# Patient Record
Sex: Female | Born: 1950
Health system: Southern US, Community
[De-identification: ages and names within clinical notes are randomized; demographics above are authoritative.]

---

## 2012-06-20 DIAGNOSIS — G479 Sleep disorder, unspecified: Secondary | ICD-10-CM | POA: Insufficient documentation

## 2013-02-19 LAB — CBC AND DIFFERENTIAL
Hemoglobin: 13.3 g/dL (ref 12.0–16.0)
Platelets: 192 10*3/uL (ref 150–399)
WBC: 3.4 10^3/mL

## 2013-02-19 LAB — LIPID PANEL
CHOLESTEROL: 154 mg/dL (ref 0–200)
HDL: 57 mg/dL (ref 35–70)
LDL Cholesterol: 75 mg/dL
Triglycerides: 72 mg/dL (ref 40–160)

## 2013-08-21 LAB — LIPID PANEL
Cholesterol: 168 mg/dL (ref 0–200)
HDL: 56 mg/dL (ref 35–70)
LDL Cholesterol: 96 mg/dL
Triglycerides: 119 mg/dL (ref 40–160)

## 2013-08-21 LAB — VITAMIN D 25 HYDROXY (VIT D DEFICIENCY, FRACTURES): VIT D 25 HYDROXY: 53

## 2013-09-02 LAB — HM DEXA SCAN

## 2013-12-23 LAB — LIPID PANEL
Cholesterol: 163 mg/dL (ref 0–200)
HDL: 53 mg/dL (ref 35–70)
LDL CALC: 90 mg/dL
TRIGLYCERIDES: 93 mg/dL (ref 40–160)

## 2014-03-27 DIAGNOSIS — S52502A Unspecified fracture of the lower end of left radius, initial encounter for closed fracture: Secondary | ICD-10-CM | POA: Insufficient documentation

## 2014-04-07 ENCOUNTER — Ambulatory Visit: Payer: Self-pay | Admitting: Family Medicine

## 2014-04-15 DIAGNOSIS — Z9889 Other specified postprocedural states: Secondary | ICD-10-CM | POA: Insufficient documentation

## 2014-04-29 HISTORY — PX: OTHER SURGICAL HISTORY: SHX169

## 2014-05-06 ENCOUNTER — Encounter: Payer: Self-pay | Admitting: Family Medicine

## 2014-05-06 ENCOUNTER — Ambulatory Visit (INDEPENDENT_AMBULATORY_CARE_PROVIDER_SITE_OTHER): Payer: BC Managed Care – PPO | Admitting: Family Medicine

## 2014-05-06 VITALS — BP 111/65 | HR 83 | Ht 67.5 in | Wt 194.0 lb

## 2014-05-06 DIAGNOSIS — G4700 Insomnia, unspecified: Secondary | ICD-10-CM

## 2014-05-06 DIAGNOSIS — E663 Overweight: Secondary | ICD-10-CM | POA: Diagnosis not present

## 2014-05-06 DIAGNOSIS — E785 Hyperlipidemia, unspecified: Secondary | ICD-10-CM | POA: Diagnosis not present

## 2014-05-06 DIAGNOSIS — S62102G Fracture of unspecified carpal bone, left wrist, subsequent encounter for fracture with delayed healing: Secondary | ICD-10-CM

## 2014-05-06 MED ORDER — TRIAMCINOLONE ACETONIDE 0.5 % EX OINT: 1.0000 "application " | TOPICAL_OINTMENT | Freq: Every day | CUTANEOUS | Status: DC

## 2014-05-06 NOTE — Patient Instructions (Signed)
My Fitness Pal
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
## 2014-05-13 LAB — COMPLETE METABOLIC PANEL WITH GFR
ALT: 13 U/L (ref 0–35)
AST: 14 U/L (ref 0–37)
Albumin: 4.2 g/dL (ref 3.5–5.2)
Alkaline Phosphatase: 60 U/L (ref 39–117)
BILIRUBIN TOTAL: 0.6 mg/dL (ref 0.2–1.2)
BUN: 17 mg/dL (ref 6–23)
CHLORIDE: 104 meq/L (ref 96–112)
CO2: 29 mEq/L (ref 19–32)
Calcium: 9.2 mg/dL (ref 8.4–10.5)
Creat: 0.94 mg/dL (ref 0.50–1.10)
GFR, Est African American: 74 mL/min
GFR, Est Non African American: 64 mL/min
Glucose, Bld: 88 mg/dL (ref 70–99)
Potassium: 4.4 mEq/L (ref 3.5–5.3)
SODIUM: 141 meq/L (ref 135–145)
TOTAL PROTEIN: 6.7 g/dL (ref 6.0–8.3)

## 2014-05-13 LAB — LIPID PANEL
CHOL/HDL RATIO: 2.8 ratio
Cholesterol: 179 mg/dL (ref 0–200)
HDL: 65 mg/dL (ref >=46)
LDL CALC: 93 mg/dL (ref 0–99)
Triglycerides: 103 mg/dL (ref <150)
VLDL: 21 mg/dL (ref 0–40)

## 2014-05-13 NOTE — Progress Notes (Signed)
Quick Note:  All labs are normal. ______ 

## 2014-05-21 ENCOUNTER — Encounter: Payer: Self-pay | Admitting: Family Medicine

## 2014-05-21 DIAGNOSIS — M8580 Other specified disorders of bone density and structure, unspecified site: Secondary | ICD-10-CM | POA: Insufficient documentation

## 2014-05-21 DIAGNOSIS — Z87891 Personal history of nicotine dependence: Secondary | ICD-10-CM | POA: Insufficient documentation

## 2014-06-05 ENCOUNTER — Encounter: Payer: Self-pay | Admitting: Family Medicine

## 2014-07-09 ENCOUNTER — Telehealth: Payer: Self-pay | Admitting: *Deleted

## 2014-07-09 DIAGNOSIS — H8110 Benign paroxysmal vertigo, unspecified ear: Secondary | ICD-10-CM

## 2014-07-09 NOTE — Telephone Encounter (Signed)
Pt called and stated that she is currently being seen by PT in [place] @ [facility] for her wrist and she is now experiencing vetigo and you would like Dr. Linford ArnoldMetheney to put orders in for her to get tx for this. She was asking if this was possible. I asked her about her sx and she informed me that she has been experiencing dizziness upon standing, and sometimes when she is walking. I advised her that Dr. Jarrett SohoMetheneny could see her for this, and that we have a hand-out that I could send her that has exercises that would help, or that she could go online and look this up. She states that she would still like for the referral to be placed and that she was going to call our PT department to see if her insurance will cover her coming here to have her PT done. Otherwise she would like for me to send the referral to [facility]. Referral fax# [phone] ph: [phone].Loralee PacasBarkley, [place]zon [place]

## 2014-07-24 ENCOUNTER — Telehealth: Payer: Self-pay | Admitting: *Deleted

## 2014-07-24 NOTE — Telephone Encounter (Signed)
Pt is experiencing vertigo and would like to be seen. She stated that she is being treated by the rehab however she's not feeling well today. Pt given appt for tomorrow.Alexis PacasBarkley, Christain Niznik [place]

## 2014-07-25 ENCOUNTER — Ambulatory Visit (INDEPENDENT_AMBULATORY_CARE_PROVIDER_SITE_OTHER): Payer: BC Managed Care – PPO | Admitting: Family Medicine

## 2014-07-25 ENCOUNTER — Encounter: Payer: Self-pay | Admitting: Family Medicine

## 2014-07-25 VITALS — BP 166/106 | HR 76 | Wt 192.0 lb

## 2014-07-25 DIAGNOSIS — R42 Dizziness and giddiness: Secondary | ICD-10-CM | POA: Diagnosis not present

## 2014-07-25 DIAGNOSIS — I10 Essential (primary) hypertension: Secondary | ICD-10-CM | POA: Diagnosis not present

## 2014-07-25 MED ORDER — HYDROCHLOROTHIAZIDE 25 MG PO TABS: ORAL_TABLET | ORAL | Status: DC

## 2014-07-25 MED ORDER — MECLIZINE HCL 25 MG PO TABS: 25.0000 mg | ORAL_TABLET | Freq: Three times a day (TID) | ORAL | Status: DC | PRN

## 2014-07-25 NOTE — Progress Notes (Signed)
CC: Alexis Fisher is a 64 y.o. female is here for Dizziness   Subjective: HPI:  Dizziness that has been present for the last month. Present almost on a daily basis but has improved since getting vestibular rehabilitation. She's had 5 days straight of resolution of her dizziness however it returned yesterday. It's severe in severity, episodic, worse with any rotational movement of the head or lying down. No medication interventions as of yet. Denies any other motor or sensory disturbances. Denies any cognitive changes from baseline. Denies any disorientation. Has a headache today in severity nothing particularly makes it better or worse. Denies any recent head trauma. States that blood pressure has always been normal in the past but rehabilitation systolic was 160 yesterday.   Review Of Systems Outlined In HPI  No past medical history on file.  Past Surgical History  Procedure Laterality Date  . Wrsit surgery, plate Left 1/61093/2016    Dr. Dewaine OatsBrumfield   Family History  Problem Relation Age of Onset  . Kidney failure Father     History   Social History  . Marital Status: Married    Spouse Name: Alexis Fisher  . Number of Children: 2  . Years of Education: N/A   Occupational History  . retired    Social History Main Topics  . Smoking status: Former Smoker    Types: Cigarettes    Quit date: 02/28/1998  . Smokeless tobacco: Not on file  . Alcohol Use: 2.4 oz/week    4 Glasses of wine per week     Comment: wine and beer  . Drug Use: No  . Sexual Activity:    Partners: Male   Other Topics Concern  . 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 Married to [place] with 2 adult children.     Objective: BP 166/106 mmHg  Pulse 76  Wt 192 lb (87.091 kg)  General: Alert and Oriented, No Acute Distress HEENT: Pupils equal, round, reactive to light. Conjunctivae clear.  External ears unremarkable,  canals clear with intact TMs with appropriate landmarks.  Middle ear appears open without effusion. Pink inferior turbinates.  Moist mucous membranes, pharynx without inflammation nor lesions.  Neck supple without palpable lymphadenopathy nor abnormal masses. Lungs: Clear and comfortable work of breathing Cardiac: Regular rate and rhythm Cranial nerves II through XII grossly intact Extremities: No peripheral edema.  Strong peripheral pulses.  Mental Status: No depression, anxiety, nor agitation. Skin: Warm and dry.  Assessment & Plan: Alexis Fisher was seen today for dizziness.  Diagnoses and all orders for this visit:  Essential hypertension Orders: -     hydrochlorothiazide (HYDRODIURIL) 25 MG tablet; One tablet by mouth every morning.  Dizziness Orders: -     meclizine (ANTIVERT) 25 MG tablet; Take 1-2 tablets (25-50 mg total) by mouth 3 (three) times daily as needed for dizziness.   essential hypertension: New diagnosis, Start hydrochlorothiazide and record blood pressures twice a day drop them off for my review on Tuesday. Dizziness: Continue to see vestibular rehabilitation start Antivert as needed.  Signs and symptoms requring emergent/urgent reevaluation were discussed with the patient.    Return Dr. Linford ArnoldMetheney in two weeks..Marland Kitchen

## 2014-07-29 ENCOUNTER — Telehealth: Payer: Self-pay | Admitting: *Deleted

## 2014-07-29 MED ORDER — DIAZEPAM 5 MG PO TABS: 2.5000 mg | ORAL_TABLET | Freq: Two times a day (BID) | ORAL | Status: DC | PRN

## 2014-07-29 NOTE — Telephone Encounter (Signed)
Alexis Fisher, Valium as a substitute to meclizine Rx placed in in-box ready for pickup/faxing. Please also ask that she schedule an appointment with Dr. Judie PetitM late this or early next week for continuity of care purposes since I saw her for an acute visit.

## 2014-07-29 NOTE — Telephone Encounter (Signed)
Pt states she is really not feeling that much better since having been on the meclizine. She is taking two tablets every 8 hours.please advise

## 2014-07-30 NOTE — Telephone Encounter (Signed)
Pt notified and voiced understanding 

## 2014-10-28 LAB — HM MAMMOGRAPHY

## 2014-11-06 ENCOUNTER — Other Ambulatory Visit (HOSPITAL_COMMUNITY)
Admission: RE | Admit: 2014-11-06 | Discharge: 2014-11-06 | Disposition: A | Discharge status: Home / Self Care | Payer: BC Managed Care – PPO | Source: Ambulatory Visit | Attending: Family Medicine | Admitting: Family Medicine

## 2014-11-06 ENCOUNTER — Ambulatory Visit (INDEPENDENT_AMBULATORY_CARE_PROVIDER_SITE_OTHER): Payer: BC Managed Care – PPO | Admitting: Family Medicine

## 2014-11-06 ENCOUNTER — Encounter: Payer: Self-pay | Admitting: Family Medicine

## 2014-11-06 VITALS — BP 123/73 | HR 79 | Temp 97.9°F | Ht 67.5 in | Wt 196.0 lb

## 2014-11-06 DIAGNOSIS — Z1151 Encounter for screening for human papillomavirus (HPV): Secondary | ICD-10-CM | POA: Insufficient documentation

## 2014-11-06 DIAGNOSIS — R0683 Snoring: Secondary | ICD-10-CM

## 2014-11-06 DIAGNOSIS — Z0000 Encounter for general adult medical examination without abnormal findings: Secondary | ICD-10-CM | POA: Diagnosis not present

## 2014-11-06 DIAGNOSIS — Z01419 Encounter for gynecological examination (general) (routine) without abnormal findings: Secondary | ICD-10-CM | POA: Insufficient documentation

## 2014-11-06 DIAGNOSIS — Z0189 Encounter for other specified special examinations: Secondary | ICD-10-CM | POA: Diagnosis not present

## 2014-11-06 DIAGNOSIS — Z23 Encounter for immunization: Secondary | ICD-10-CM

## 2014-11-06 DIAGNOSIS — G4700 Insomnia, unspecified: Secondary | ICD-10-CM

## 2014-11-06 MED ORDER — TRAZODONE HCL 50 MG PO TABS: 50.0000 mg | ORAL_TABLET | Freq: Every evening | ORAL | Status: DC | PRN

## 2014-11-06 NOTE — Progress Notes (Signed)
Subjective:     Alexis Fisher is a 64 y.o. female and is here for a comprehensive physical exam. The patient reports no problems. She's doing well with the trazodone for sleep. She says most of the time she tries to take half of a tab. Occasionally will take a whole tab. Says sometimes a whole tab doesn't seem to work but she really doesn't want to go up on her dose. She would like a printed prescription today.  Social History   Social History  . Marital Status: Married    Spouse Name: Ramon Dredge  . Number of Children: 2  . Years of Education: N/A   Occupational History  . retired    Social History Main Topics  . Smoking status: Former Smoker    Types: Cigarettes    Quit date: 02/28/1998  . Smokeless tobacco: Not on file  . Alcohol Use: 2.4 oz/week    4 Glasses of wine per week     Comment: wine and beer  . Drug Use: No  . Sexual Activity:    Partners: Male   Other Topics Concern  . 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 Married to John Sevier with 2 adult children.   Health Maintenance  Topic Date Due  . Hepatitis C Screening  [birth year], 1950  . HIV Screening  12/20/1964  . TETANUS/TDAP  12/20/1968  . PAP SMEAR  12/21/1970  . MAMMOGRAM  12/21/1999  . ZOSTAVAX  12/20/2009  . INFLUENZA VACCINE  09/29/2014  . COLONOSCOPY  02/29/2024    The following portions of the patient's history were reviewed and updated as appropriate: allergies, current medications, past family history, past medical history, past social history, past surgical history and problem list.  Review of Systems A comprehensive review of systems was negative.   Objective:    BP 123/73 mmHg  Pulse 79  Temp(Src) 97.9 F (36.6 C)  Ht 5' 7.5" (1.715 m)  Wt 196 lb (88.905 kg)  BMI 30.23 kg/m2 General appearance: alert, cooperative and appears stated age Head: Normocephalic, without obvious abnormality,  atraumatic Eyes: conj clear, EOMI, PEERLA Ears: normal TM's and external ear canals both ears Nose: Nares normal. Septum midline. Mucosa normal. No drainage or sinus tenderness. Throat: lips, mucosa, and tongue normal; teeth and gums normal Neck: no adenopathy, no carotid bruit, no JVD, supple, symmetrical, trachea midline and thyroid not enlarged, symmetric, no tenderness/mass/nodules Back: symmetric, no curvature. ROM normal. No CVA tenderness. Lungs: clear to auscultation bilaterally Breasts: normal appearance, no masses or tenderness Heart: regular rate and rhythm, S1, S2 normal, no murmur, click, rub or gallop Abdomen: soft, non-tender; bowel sounds normal; no masses,  no organomegaly Pelvic: cervix normal in appearance, external genitalia normal, no adnexal masses or tenderness, no cervical motion tenderness, rectovaginal septum normal, uterus normal size, shape, and consistency, vagina normal without discharge and question smallpolp at opening of cervix. Very tiny though. Recommend repeat pelvic exam next year.  Extremities: extremities normal, atraumatic, no cyanosis or edema Pulses: 2+ and symmetric Skin: Skin color, texture, turgor normal. No rashes or lesions Lymph nodes: Cervical, supraclavicular, and axillary nodes normal. Neurologic: Alert and oriented X 3, normal strength and tone. Normal symmetric reflexes. Normal coordination and gait    Assessment:    Healthy female exam.      Plan:     See After Visit Summary for Counseling Recommendations  Keep up a regular exercise program and  make sure you are eating a healthy diet Try to eat 4 servings of dairy a day, or if you are lactose intolerant take a calcium with vitamin D daily.  Your vaccines are up to date.   Insomnia-doing well with the trazodone. Refill printed today.  Snoring x 5+ years. Family wanted her to discuss with [place] today. She said she still has her adenoids and is mostly a mouth breather even during the  daytime. She says the snoring has gotten so bad that she has been now sleeping in separate rooms. She denies any known apneas. She denies any morning headaches and denies any excessive daytime sleepiness. She does report waking up and not feeling refreshed. Her STOP BANG questionnair is neg so will refer her to ENT for further evaluation.

## 2014-11-06 NOTE — Patient Instructions (Signed)
Keep up a regular exercise program and make sure you are eating a healthy diet Try to eat 4 servings of dairy a day, or if you are lactose intolerant take a calcium with vitamin D daily.  Your vaccines are up to date.   

## 2014-11-11 LAB — CYTOLOGY - PAP

## 2014-11-12 ENCOUNTER — Other Ambulatory Visit: Payer: Self-pay | Admitting: *Deleted

## 2014-11-12 DIAGNOSIS — H8110 Benign paroxysmal vertigo, unspecified ear: Secondary | ICD-10-CM

## 2014-11-14 ENCOUNTER — Other Ambulatory Visit: Payer: Self-pay | Admitting: *Deleted

## 2014-11-14 DIAGNOSIS — R42 Dizziness and giddiness: Secondary | ICD-10-CM

## 2014-11-14 MED ORDER — MECLIZINE HCL 25 MG PO TABS: 25.0000 mg | ORAL_TABLET | Freq: Three times a day (TID) | ORAL | Status: DC | PRN

## 2014-12-22 ENCOUNTER — Ambulatory Visit (INDEPENDENT_AMBULATORY_CARE_PROVIDER_SITE_OTHER): Payer: Medicare (Managed Care) | Admitting: Family Medicine

## 2014-12-22 ENCOUNTER — Encounter: Payer: Self-pay | Admitting: Family Medicine

## 2014-12-22 VITALS — BP 133/94 | HR 100 | Wt 192.0 lb

## 2014-12-22 DIAGNOSIS — H6982 Other specified disorders of Eustachian tube, left ear: Secondary | ICD-10-CM

## 2014-12-22 MED ORDER — PREDNISONE 20 MG PO TABS: ORAL_TABLET | ORAL | Status: AC
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
## 2015-02-20 ENCOUNTER — Other Ambulatory Visit: Payer: Self-pay | Admitting: Family Medicine

## 2015-02-20 MED ORDER — TRAZODONE HCL 50 MG PO TABS: 50.0000 mg | ORAL_TABLET | Freq: Every evening | ORAL | Status: DC | PRN

## 2015-02-20 NOTE — Addendum Note (Signed)
Addended by: Collie SiadICHARDSON, Maridee Slape M on: 02/20/2015 03:36 PM   Modules accepted: Orders, Medications

## 2015-04-07 ENCOUNTER — Encounter: Payer: Self-pay | Admitting: Family Medicine

## 2015-04-17 ENCOUNTER — Other Ambulatory Visit: Payer: Self-pay | Admitting: Physician Assistant

## 2015-04-17 MED ORDER — ATORVASTATIN CALCIUM 40 MG PO TABS: 40.0000 mg | ORAL_TABLET | Freq: Every day | ORAL | Status: DC

## 2015-04-21 ENCOUNTER — Telehealth: Payer: Self-pay | Admitting: Family Medicine

## 2015-04-21 ENCOUNTER — Other Ambulatory Visit: Payer: Self-pay

## 2015-04-21 DIAGNOSIS — E785 Hyperlipidemia, unspecified: Secondary | ICD-10-CM

## 2015-04-21 DIAGNOSIS — Z114 Encounter for screening for human immunodeficiency virus [HIV]: Secondary | ICD-10-CM

## 2015-04-21 DIAGNOSIS — Z0000 Encounter for general adult medical examination without abnormal findings: Secondary | ICD-10-CM

## 2015-04-21 DIAGNOSIS — M8580 Other specified disorders of bone density and structure, unspecified site: Secondary | ICD-10-CM

## 2015-04-21 DIAGNOSIS — Z1159 Encounter for screening for other viral diseases: Secondary | ICD-10-CM

## 2015-04-21 MED ORDER — ATORVASTATIN CALCIUM 40 MG PO TABS: 40.0000 mg | ORAL_TABLET | Freq: Every day | ORAL | Status: DC

## 2015-04-21 NOTE — Telephone Encounter (Signed)
Orders placed.Alexis Fisher  

## 2015-04-21 NOTE — Telephone Encounter (Signed)
Patient has an appointment scheduled for 06/01/15 at 2:30 and would like a lab order sent down the week before so she can go over her results at her appointment. Thanks

## 2015-05-21 LAB — COMPLETE METABOLIC PANEL WITH GFR
ALBUMIN: 3.9 g/dL (ref 3.6–5.1)
ALK PHOS: 49 U/L (ref 33–130)
ALT: 14 U/L (ref 6–29)
AST: 14 U/L (ref 10–35)
BILIRUBIN TOTAL: 0.6 mg/dL (ref 0.2–1.2)
BUN: 17 mg/dL (ref 7–25)
CALCIUM: 9 mg/dL (ref 8.6–10.4)
CO2: 27 mmol/L (ref 20–31)
Chloride: 106 mmol/L (ref 98–110)
Creat: 1.02 mg/dL — ABNORMAL HIGH (ref 0.50–0.99)
GFR, EST AFRICAN AMERICAN: 67 mL/min (ref >=60)
GFR, EST NON AFRICAN AMERICAN: 58 mL/min — AB (ref >=60)
Glucose, Bld: 88 mg/dL (ref 65–99)
POTASSIUM: 4.2 mmol/L (ref 3.5–5.3)
Sodium: 139 mmol/L (ref 135–146)
TOTAL PROTEIN: 6.1 g/dL (ref 6.1–8.1)

## 2015-05-21 LAB — LIPID PANEL
CHOL/HDL RATIO: 2.7 ratio (ref <=5.0)
Cholesterol: 155 mg/dL (ref 125–200)
HDL: 57 mg/dL (ref >=46)
LDL CALC: 79 mg/dL (ref <130)
Triglycerides: 97 mg/dL (ref <150)
VLDL: 19 mg/dL (ref <30)

## 2015-05-21 LAB — VITAMIN D 25 HYDROXY (VIT D DEFICIENCY, FRACTURES): VIT D 25 HYDROXY: 34 ng/mL (ref 30–100)

## 2015-05-21 LAB — HEPATITIS C ANTIBODY: HCV Ab: NEGATIVE

## 2015-05-21 LAB — HIV ANTIBODY (ROUTINE TESTING W REFLEX): HIV 1&2 Ab, 4th Generation: NONREACTIVE

## 2015-06-01 ENCOUNTER — Encounter: Payer: Self-pay | Admitting: Family Medicine

## 2015-06-01 ENCOUNTER — Ambulatory Visit (INDEPENDENT_AMBULATORY_CARE_PROVIDER_SITE_OTHER): Payer: Medicare (Managed Care) | Admitting: Family Medicine

## 2015-06-01 VITALS — BP 108/76 | HR 67 | Wt 189.0 lb

## 2015-06-01 DIAGNOSIS — N183 Chronic kidney disease, stage 3 unspecified: Secondary | ICD-10-CM

## 2015-06-01 DIAGNOSIS — E559 Vitamin D deficiency, unspecified: Secondary | ICD-10-CM | POA: Diagnosis not present

## 2015-06-01 DIAGNOSIS — E785 Hyperlipidemia, unspecified: Secondary | ICD-10-CM

## 2015-06-01 LAB — POCT URINALYSIS DIPSTICK
Bilirubin, UA: NEGATIVE
Glucose, UA: NEGATIVE
Ketones, UA: NEGATIVE
Leukocytes, UA: NEGATIVE
NITRITE UA: NEGATIVE
PH UA: 5.5
Protein, UA: NEGATIVE
UROBILINOGEN UA: 0.2

## 2015-06-01 LAB — POCT UA - MICROALBUMIN
Albumin/Creatinine Ratio, Urine, POC: <30
CREATININE, POC: 100 mg/dL
MICROALBUMIN (UR) POC: 10 mg/L

## 2015-06-01 MED ORDER — TRAZODONE HCL 50 MG PO TABS: 50.0000 mg | ORAL_TABLET | Freq: Every evening | ORAL | Status: DC | PRN

## 2015-06-01 NOTE — Progress Notes (Signed)
   Subjective:    Patient ID: Alexis FormSusan Gerwig, female    DOB: 1950-08-20, 65 y.o.   MRN: [id number]  HPI She is here today to go over her blood work. She was negative for hepatitis C and negative for HIV. Her vitamin D was a little bit low but she wasn't sure what kind to start taking over-the-counter so she has not picked that up yet.   No prior history of kidney disease. Her father had renal failure but his was felt to be secondary to medications that he was taking for his gout. She also has a sister who has some type of genetic issue that has caused renal failure as well.  Hyperlipidemia-tolerating statin well without any side effects or problems.  Review of Systems   He has never noticed that she urinates a little bit more frequently.    Objective:   Physical Exam  Constitutional: She is oriented to person, place, and time. She appears well-developed and well-nourished.  HENT:  Head: Normocephalic and atraumatic.  Eyes: Conjunctivae and EOM are normal.  Cardiovascular: Normal rate.   Pulmonary/Chest: Effort normal.  Neurological: She is alert and oriented to person, place, and time.  Skin: Skin is dry. No pallor.  Psychiatric: She has a normal mood and affect. Her behavior is normal.  Vitals reviewed.      Assessment & Plan:  Vitamin D deficiency-recommend over-the-counter vitamin D3 1000 international units daily.  CKD 3 - discussed new diagnosis. Recommend urine micral albumin be performed. Also recommend renal ultrasound for further evaluation. Recheck function in   Hyperlipidemia-doing well on statin. Lipids are up-to-date.

## 2015-06-04 ENCOUNTER — Ambulatory Visit (INDEPENDENT_AMBULATORY_CARE_PROVIDER_SITE_OTHER): Payer: Medicare Other

## 2015-06-04 DIAGNOSIS — N183 Chronic kidney disease, stage 3 unspecified: Secondary | ICD-10-CM

## 2015-06-15 ENCOUNTER — Encounter: Payer: Self-pay | Admitting: Physician Assistant

## 2015-06-15 ENCOUNTER — Ambulatory Visit (INDEPENDENT_AMBULATORY_CARE_PROVIDER_SITE_OTHER): Payer: Medicare Other | Admitting: Physician Assistant

## 2015-06-15 VITALS — BP 108/68 | HR 83 | Temp 99.1°F | Ht 67.5 in | Wt 185.0 lb

## 2015-06-15 DIAGNOSIS — J208 Acute bronchitis due to other specified organisms: Secondary | ICD-10-CM

## 2015-06-15 MED ORDER — AZITHROMYCIN 250 MG PO TABS: ORAL_TABLET | ORAL | Status: DC

## 2015-06-15 MED ORDER — BENZONATATE 200 MG PO CAPS: 200.0000 mg | ORAL_CAPSULE | Freq: Two times a day (BID) | ORAL | Status: DC | PRN

## 2015-06-15 NOTE — Patient Instructions (Signed)

## 2015-06-15 NOTE — Progress Notes (Signed)
   Subjective:    Patient ID: Alexis Fisher, female    DOB: 12/14/1950, 65 y.o.   MRN: [id number]  HPI Patient is a 65 year old female who presents to the clinic with 4 days of cough, fatigue, weakness. Her sister came in from out of town and was also sick. She feels like she had the same thing as her sister. Claybon JabsHurst has continued to worsen and seems to be more severe. She is taking DayQuil and NyQuil regularly with little to no relief. Nothing up productive yellow to green sputum. She slept most of the day today. She has had a low-grade fever. She has off and on cold sweat and body aches. She denies any nausea or vomiting. She denies any sinus pressure, ear pain or headache. She does have a sore throat.   Review of Systems See HPI.     Objective:   Physical Exam  Constitutional: She is oriented to person, place, and time. She appears well-developed and well-nourished.  HENT:  Head: Normocephalic and atraumatic.  Right Ear: External ear normal.  Left Ear: External ear normal.  Nose: Nose normal.  Mouth/Throat: Oropharynx is clear and moist. No oropharyngeal exudate.  Eyes: Conjunctivae are normal. Right eye exhibits no discharge. Left eye exhibits no discharge.  Neck: Normal range of motion. Neck supple.  Cardiovascular: Normal rate, regular rhythm and normal heart sounds.   Pulmonary/Chest:  No wheezing, rhonchi, crackles.  Cough on exam that was persistent.  Lymphadenopathy:    She has no cervical adenopathy.  Neurological: She is alert and oriented to person, place, and time.  Psychiatric: She has a normal mood and affect. Her behavior is normal.          Assessment & Plan:  Acute bronchitis-Z-Pak sent to pharmacy today. Tessalon Perles given to use as needed for cough. Discussed symptomatic care with honey and lemon drops. Can consider ibuprofen for sore throat and salt water gargles. Handout given for bronchitis. Follow-up if not improving in could consider a course of  prednisone to see if what also help with symptoms.

## 2015-06-16 DIAGNOSIS — R7989 Other specified abnormal findings of blood chemistry: Secondary | ICD-10-CM | POA: Insufficient documentation

## 2015-07-31 ENCOUNTER — Other Ambulatory Visit: Payer: Self-pay | Admitting: Family Medicine

## 2015-08-06 ENCOUNTER — Encounter: Payer: Self-pay | Admitting: Family Medicine

## 2015-08-06 ENCOUNTER — Ambulatory Visit (INDEPENDENT_AMBULATORY_CARE_PROVIDER_SITE_OTHER): Payer: Medicare Other | Admitting: Family Medicine

## 2015-08-06 VITALS — BP 132/66 | HR 59 | Wt 185.0 lb

## 2015-08-06 DIAGNOSIS — M8580 Other specified disorders of bone density and structure, unspecified site: Secondary | ICD-10-CM

## 2015-08-06 DIAGNOSIS — Z0000 Encounter for general adult medical examination without abnormal findings: Secondary | ICD-10-CM | POA: Diagnosis not present

## 2015-08-06 DIAGNOSIS — Z23 Encounter for immunization: Secondary | ICD-10-CM

## 2015-08-06 NOTE — Progress Notes (Signed)
Subjective:    Alexis Fisher is a 65 y.o. female who presents for a Welcome to Medicare exam.   She did update me today. She was recently hospitalized overnight after syncopal episode. She had been sick for about 5 days with bronchitis and upper respiratory infection. She was started on erythromycin which she says also cause some stomach upset. Unfortunately she passed out. They did a workup that was negative for any cardiac or neurologic causes and was discharged home. She has been well since then. They did recommend that we recheck a CBC post discharge.  Review of Systems comprehensive ROS is negative.          Objective:    Today's Vitals   08/06/15 0915  BP: 132/66  Pulse: 59  Weight: 185 lb (83.915 kg)  SpO2: 100%  Body mass index is 28.53 kg/(m^2).  Medications Outpatient Encounter Prescriptions as of 08/06/2015  Medication Sig  . atorvastatin (LIPITOR) 40 MG tablet TAKE 1 TABLET (40 MG TOTAL) BY MOUTH DAILY AT 6 PM.  . [DISCONTINUED] azithromycin (ZITHROMAX) 250 MG tablet Take 2 tablets now and then one tablet for 4 days.  . [DISCONTINUED] benzonatate (TESSALON) 200 MG capsule Take 1 capsule (200 mg total) by mouth 2 (two) times daily as needed for cough.  . [DISCONTINUED] traZODone (DESYREL) 50 MG tablet Take 1 tablet (50 mg total) by mouth at bedtime as needed. for sleep   No facility-administered encounter medications on file as of 08/06/2015.     History: No past medical history on file. Past Surgical History  Procedure Laterality Date  . Wrsit surgery, plate Left 1/61093/2016    Dr. Dewaine OatsBrumfield    Family History  Problem Relation Age of Onset  . Kidney failure Father    Social History   Occupational History  . retired    Social History Main Topics  . Smoking status: Former Smoker    Types: Cigarettes    Quit date: 02/28/1998  . Smokeless tobacco: Not on file  . Alcohol Use: 2.4 oz/week    4 Glasses of wine per week     Comment: wine and beer  . Drug Use:  No  . Sexual Activity:    Partners: Male    Tobacco Counseling Counseling given: Not Answered   Immunizations and Health Maintenance Immunization History  Administered Date(s) Administered  . Influenza,inj,Quad PF,36+ Mos 11/06/2014  . Tdap 11/06/2014   Health Maintenance Due  Topic Date Due  . ZOSTAVAX  12/20/2009  . DEXA SCAN  12/21/2014  . PNA vac Low Risk Adult (1 of 2 - PCV13) 12/21/2014    Activities of Daily Living No flowsheet data found.  Physical Exam  (optional), or other factors deemed appropriate based on the beneficiary's medical and social history and current clinical standards.  Physical Exam  Constitutional: She is oriented to person, place, and time. She appears well-developed and well-nourished.  HENT:  Head: Normocephalic and atraumatic.  Right Ear: External ear normal.  Left Ear: External ear normal.  Nose: Nose normal.  Mouth/Throat: Oropharynx is clear and moist. No oropharyngeal exudate.  Eyes: Conjunctivae are normal. Pupils are equal, round, and reactive to light.  Neck: Neck supple. No thyromegaly present.  Cardiovascular: Normal rate and normal heart sounds.   No murmur heard. Pulmonary/Chest: Effort normal and breath sounds normal. Right breast exhibits no inverted nipple, no mass, no nipple discharge, no skin change and no tenderness. Left breast exhibits no inverted nipple, no mass, no nipple discharge, no skin  change and no tenderness. Breasts are symmetrical.  Abdominal: Soft. Bowel sounds are normal. She exhibits no distension and no mass. There is no tenderness. There is no rebound and no guarding.  Musculoskeletal: She exhibits no edema.  Lymphadenopathy:    She has no cervical adenopathy.  Neurological: She is alert and oriented to person, place, and time. No cranial nerve deficit.  Skin: Skin is warm and dry. Rash noted.  Multiple scattered actinic keratoses on the forearms and upper arms bilaterally.  Psychiatric: She has a normal  mood and affect. Her behavior is normal.    Physical Exam  Constitutional: She is oriented to person, place, and time. She appears well-developed and well-nourished.  HENT:  Head: Normocephalic and atraumatic.  Right Ear: External ear normal.  Left Ear: External ear normal.  Nose: Nose normal.  Mouth/Throat: Oropharynx is clear and moist. No oropharyngeal exudate.  Eyes: Conjunctivae are normal. Pupils are equal, round, and reactive to light.  Neck: Neck supple. No thyromegaly present.  Cardiovascular: Normal rate and normal heart sounds.   No murmur heard. Respiratory: Effort normal and breath sounds normal. Right breast exhibits no inverted nipple, no mass, no nipple discharge, no skin change and no tenderness. Left breast exhibits no inverted nipple, no mass, no nipple discharge, no skin change and no tenderness. Breasts are symmetrical.  GI: Soft. Bowel sounds are normal. She exhibits no distension and no mass. There is no tenderness. There is no rebound and no guarding.  Musculoskeletal: She exhibits no edema.  Lymphadenopathy:    She has no cervical adenopathy.  Neurological: She is alert and oriented to person, place, and time. No cranial nerve deficit.  Skin: Skin is warm and dry. Rash noted.  Multiple scattered actinic keratoses on the forearms and upper arms bilaterally.  Psychiatric: She has a normal mood and affect. Her behavior is normal.    Advanced Directives:      Assessment:    This is a routine wellness examination for this patient .   Vision/Hearing screen  Visual Acuity Screening   Right eye Left eye Both eyes  Without correction:  With correction:       Dietary issues and exercise activities discussed:     Goals    None     Depression Screen PHQ 2/9 Scores 08/06/2015 06/01/2015  PHQ - 2 Score 0 0     Fall Risk Fall Risk  08/06/2015  Falls in the past year? No    MMSE: No flowsheet data found.  Patient Care Team: Agapito Games, MD as PCP - General (Family Medicine)     Plan:     During the course of the visit the patient was educated and counseled about the following appropriate screening and preventive services:   Vaccines to include Pneumoccal,   Electrocardiogram - EKG rate of  60 bpm, NSR, normal axis, with no changes   Cardiovascular Disease  Colorectal cancer screening  Bone density screening  Diabetes screening  Glaucoma screening- schedule year eye exam.   Mammography/PAP  Nutrition counseling  Her current medications and allergies were reviewed and needed refills of her chronic medications were ordered. The plan for yearly health maintenance was discussed and all orders and referrals were made as appropriate.  Patient Instructions (the written plan) was given to the patient.   METHENEY,CATHERINE, MD 08/06/2015

## 2015-08-06 NOTE — Patient Instructions (Signed)
Keep up a regular exercise program and make sure you are eating a healthy diet Try to eat 4 servings of dairy a day, or if you are lactose intolerant take a calcium with vitamin D daily.  Your vaccines are up to date.    [facility], [facility], Dr. Janalyn HarderStuart Tafeen [place]. Are some suggestions for dermatology. The lesions on her forearms or caught actinic keratoses and do need to be treated.

## 2015-08-10 ENCOUNTER — Encounter: Payer: Medicare (Managed Care) | Admitting: Family Medicine

## 2015-11-26 ENCOUNTER — Telehealth: Payer: Self-pay | Admitting: Family Medicine

## 2015-11-26 DIAGNOSIS — M8580 Other specified disorders of bone density and structure, unspecified site: Secondary | ICD-10-CM

## 2015-11-26 NOTE — Telephone Encounter (Signed)
Patient would like a Bone Density Order sent to [facility]. Thanks

## 2015-11-26 NOTE — Telephone Encounter (Signed)
Ok to place order 

## 2015-11-27 DIAGNOSIS — M8580 Other specified disorders of bone density and structure, unspecified site: Secondary | ICD-10-CM

## 2015-11-27 NOTE — Telephone Encounter (Signed)
Order placed

## 2015-12-07 LAB — HM MAMMOGRAPHY

## 2015-12-14 ENCOUNTER — Encounter: Payer: Self-pay | Admitting: Family Medicine

## 2016-05-11 ENCOUNTER — Other Ambulatory Visit: Payer: Self-pay | Admitting: *Deleted

## 2016-05-11 MED ORDER — ATORVASTATIN CALCIUM 40 MG PO TABS: ORAL_TABLET | ORAL | 2 refills | Status: DC

## 2016-05-16 ENCOUNTER — Ambulatory Visit (INDEPENDENT_AMBULATORY_CARE_PROVIDER_SITE_OTHER): Payer: Medicare Other | Admitting: Family Medicine

## 2016-05-16 ENCOUNTER — Encounter: Payer: Self-pay | Admitting: Family Medicine

## 2016-05-16 VITALS — BP 134/80 | HR 84 | Ht 68.0 in | Wt 189.0 lb

## 2016-05-16 DIAGNOSIS — J208 Acute bronchitis due to other specified organisms: Secondary | ICD-10-CM

## 2016-05-16 NOTE — Patient Instructions (Addendum)
Try the Delsym 12 hour cough syrup.   Increase water intake.   Acute Bronchitis, Adult Acute bronchitis is when air tubes (bronchi) in the lungs suddenly get swollen. The condition can make it hard to breathe. It can also cause these symptoms:  A cough.  Coughing up clear, yellow, or green mucus.  Wheezing.  Chest congestion.  Shortness of breath.  A fever.  Body aches.  Chills.  A sore throat. Follow these instructions at home: Medicines   Take over-the-counter and prescription medicines only as told by your doctor.  If you were prescribed an antibiotic medicine, take it as told by your doctor. Do not stop taking the antibiotic even if you start to feel better. General instructions   Rest.  Drink enough fluids to keep your pee (urine) clear or pale yellow.  Avoid smoking and secondhand smoke. If you smoke and you need help quitting, ask your doctor. Quitting will help your lungs heal faster.  Use an inhaler, cool mist vaporizer, or humidifier as told by your doctor.  Keep all follow-up visits as told by your doctor. This is important. How is this prevented? To lower your risk of getting this condition again:  Wash your hands often with soap and water. If you cannot use soap and water, use hand sanitizer.  Avoid contact with people who have cold symptoms.  Try not to touch your hands to your mouth, nose, or eyes.  Make sure to get the flu shot every year. Contact a doctor if:  Your symptoms do not get better in 2 weeks. Get help right away if:  You cough up blood.  You have chest pain.  You have very bad shortness of breath.  You become dehydrated.  You faint (pass out) or keep feeling like you are going to pass out.  You keep throwing up (vomiting).  You have a very bad headache.  Your fever or chills gets worse. This information is not intended to replace advice given to you by your health care provider. Make sure you discuss any questions you  have with your health care provider. Document Released: 08/03/2007 Document Revised: 09/23/2015 Document Reviewed: 08/05/2015 Elsevier Interactive Patient Education  2017 [employer]

## 2016-05-16 NOTE — Progress Notes (Signed)
   Subjective:    Patient ID: Alexis Fisher, female    DOB: [birth year], 66 y.o.   MRN: [id number]  HPI 66 year old female comes in today complaining of cough since Friday, approximally 4 days. She has not tried taking anything over-the-counter. She denies any fevers, chills, or sweats.  She has been having some low back pain with it.  + ST. no sinus congestion or facial pain or pressure.   Review of Systems     Objective:   Physical Exam  Constitutional: She is oriented to person, place, and time. She appears well-developed and well-nourished.  HENT:  Head: Normocephalic and atraumatic.  Right Ear: External ear normal.  Left Ear: External ear normal.  Nose: Nose normal.  Mouth/Throat: Oropharynx is clear and moist.  TMs and canals are clear.   Eyes: Conjunctivae and EOM are normal. Pupils are equal, round, and reactive to light.  Neck: Neck supple. No thyromegaly present.  Cardiovascular: Normal rate, regular rhythm and normal heart sounds.   Pulmonary/Chest: Effort normal and breath sounds normal. She has no wheezes.  Lymphadenopathy:    She has no cervical adenopathy.  Neurological: She is alert and oriented to person, place, and time.  Skin: Skin is warm and dry.  Psychiatric: She has a normal mood and affect.      Assessment & Plan:  Acute bronchitis-reassured patient this is likely viral. She's only been sick for about 4 days at this point. Gave her reassurance and encouraged her to try some over-the-counter medication such as Delsym for the cough. Hydrate well and get some rest. She's not improving in the next couple days and please give us a call back.

## 2016-05-21 ENCOUNTER — Other Ambulatory Visit: Payer: Self-pay | Admitting: Physician Assistant

## 2016-05-21 MED ORDER — BENZONATATE 200 MG PO CAPS: 200.0000 mg | ORAL_CAPSULE | Freq: Two times a day (BID) | ORAL | 0 refills | Status: DC | PRN

## 2016-05-21 NOTE — Progress Notes (Signed)
Called by after hours nurse. Pt was seen on Monday and told to take deslym. She thought she was getting better but then woke up with worse cough this morning. She would like something for cough.  I will send tessalon pearles to take up to three times a day. Follow up on Monday if not continuing to improve. Encouraged sucking on cough drops. Tandy GawJade Cherylynn Liszewski PA-C

## 2016-05-23 ENCOUNTER — Telehealth: Payer: Self-pay

## 2016-05-23 MED ORDER — AZITHROMYCIN 250 MG PO TABS: ORAL_TABLET | ORAL | 0 refills | Status: AC

## 2016-05-23 NOTE — Telephone Encounter (Signed)
Notified patient.

## 2016-05-23 NOTE — Telephone Encounter (Signed)
Pt was seen last week with cough, she has used the delsym cough, however the tessalon made her dizzy.  She is still having a deep cough, and is asking if she needs to do anything else, or let it run its course.  Please advise.

## 2016-05-23 NOTE — Telephone Encounter (Signed)
Will call in zpac,

## 2016-08-11 ENCOUNTER — Encounter: Payer: Medicare Other | Admitting: Family Medicine

## 2016-08-23 ENCOUNTER — Encounter: Payer: Self-pay | Admitting: Family Medicine

## 2016-08-23 ENCOUNTER — Telehealth: Payer: Self-pay | Admitting: Family Medicine

## 2016-08-23 ENCOUNTER — Ambulatory Visit (INDEPENDENT_AMBULATORY_CARE_PROVIDER_SITE_OTHER): Payer: Medicare Other | Admitting: Family Medicine

## 2016-08-23 VITALS — BP 110/73 | HR 75 | Ht 68.0 in | Wt 190.0 lb

## 2016-08-23 DIAGNOSIS — N183 Chronic kidney disease, stage 3 (moderate): Secondary | ICD-10-CM | POA: Diagnosis not present

## 2016-08-23 DIAGNOSIS — N1831 Chronic kidney disease, stage 3a: Secondary | ICD-10-CM | POA: Insufficient documentation

## 2016-08-23 DIAGNOSIS — Z23 Encounter for immunization: Secondary | ICD-10-CM | POA: Diagnosis not present

## 2016-08-23 DIAGNOSIS — Z0000 Encounter for general adult medical examination without abnormal findings: Secondary | ICD-10-CM | POA: Diagnosis not present

## 2016-08-23 NOTE — Progress Notes (Signed)
Subjective:     Alexis Fisher is a 66 y.o. female and is here for a comprehensive physical exam. The patient reports no problems.She did have some questions about recent blood work. Particularly the fact that her kidney function was mildly elevated.  Social History   Social History  . Marital status: Married    Spouse name: Ramon Dredgedward  . Number of children: 2  . Years of education: N/A   Occupational History  . retired    Social History Main Topics  . Smoking status: Former Smoker    Types: Cigarettes    Quit date: 02/28/1998  . Smokeless tobacco: Never Used  . Alcohol use 2.4 oz/week    4 Glasses of wine per week     Comment: wine and beer  . Drug use: No  . Sexual activity: Not Currently    Partners: Male   Other Topics Concern  . 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 Married to [place] with 2 adult children.   Health Maintenance  Topic Date Due  . DEXA SCAN  12/21/2014  . PNA vac Low Risk Adult (2 of 2 - PPSV23) 08/05/2016  . INFLUENZA VACCINE  05/12/2018 (Originally 09/28/2016)  . MAMMOGRAM  12/06/2017  . COLONOSCOPY  02/29/2024  . TETANUS/TDAP  11/05/2024  . Hepatitis C Screening  Completed    The following portions of the patient's history were reviewed and updated as appropriate: allergies, current medications, past family history, past medical history, past social history, past surgical history and problem list.  Review of Systems A comprehensive review of systems was negative.   Objective:    BP 110/73   Pulse 75   Ht 5\' 8"  (1.727 m)   Wt 190 lb (86.2 kg)   BMI 28.89 kg/m  General appearance: alert, cooperative and appears stated age Head: Normocephalic, without obvious abnormality, atraumatic Eyes: conj clear, EOMI, PEERLA Ears: normal TM's and external ear canals both ears Nose: Nares normal. Septum midline. Mucosa normal. No drainage or sinus  tenderness. Throat: lips, mucosa, and tongue normal; teeth and gums normal Neck: no adenopathy, no carotid bruit, no JVD, supple, symmetrical, trachea midline and thyroid not enlarged, symmetric, no tenderness/mass/nodules Back: symmetric, no curvature. ROM normal. No CVA tenderness. Lungs: clear to auscultation bilaterally Breasts: normal appearance, no masses or tenderness Heart: regular rate and rhythm, S1, S2 normal, no murmur, click, rub or gallop Abdomen: soft, non-tender; bowel sounds normal; no masses,  no organomegaly Pelvic: cervix normal in appearance, external genitalia normal, no adnexal masses or tenderness, no cervical motion tenderness, rectovaginal septum normal, uterus normal size, shape, and consistency, vagina normal without discharge and no cervical polyp.  Extremities: extremities normal, atraumatic, no cyanosis or edema Pulses: 2+ and symmetric Skin: Skin color, texture, turgor normal. No rashes or lesions Lymph nodes: Cervical, supraclavicular, and axillary nodes normal. Neurologic: Alert and oriented X 3, normal strength and tone. Normal symmetric reflexes. Normal coordination and gait    Assessment:    Healthy female exam.      Plan:     See After Visit Summary for Counseling Recommendations   Keep up a regular exercise program and make sure you are eating a healthy diet Try to eat 4 servings of dairy a day, or if you are lactose intolerant take a calcium with vitamin D daily.  Your vaccines are up to date.  Discussed colon cancer screening. She is interested  in: Guarded as an option. Report given encouraged her to check with her insurance. Encouraged her to reschedule her mammogram. Shingles  vaccine discussed. Last bone density July 2015. T score of -1.7. Repeat in 3-5 years.   Tetanus up-to-date. Pneumovax 23 given today.

## 2016-08-23 NOTE — Telephone Encounter (Signed)
Please call patient and let her know that based on her bone density score the recommendation is to repeat the bone density in 3-5 years. If she would like to go ahead and do that and please let me know. Please let me know where she would like to have it done. [facility].

## 2016-08-24 LAB — CBC WITH DIFFERENTIAL/PLATELET
BASOS PCT: 0 %
Basophils Absolute: 0 cells/uL (ref 0–200)
EOS ABS: 82 {cells}/uL (ref 15–500)
Eosinophils Relative: 2 %
HEMATOCRIT: 40.6 % (ref 35.0–45.0)
HEMOGLOBIN: 13.4 g/dL (ref 11.7–15.5)
LYMPHS ABS: 1025 {cells}/uL (ref 850–3900)
Lymphocytes Relative: 25 %
MCH: 28.5 pg (ref 27.0–33.0)
MCHC: 33 g/dL (ref 32.0–36.0)
MCV: 86.4 fL (ref 80.0–100.0)
MONO ABS: 328 {cells}/uL (ref 200–950)
MPV: 9.1 fL (ref 7.5–12.5)
Monocytes Relative: 8 %
Neutro Abs: 2665 cells/uL (ref 1500–7800)
Neutrophils Relative %: 65 %
Platelets: 204 10*3/uL (ref 140–400)
RBC: 4.7 MIL/uL (ref 3.80–5.10)
RDW: 13.5 % (ref 11.0–15.0)
WBC: 4.1 10*3/uL (ref 3.8–10.8)

## 2016-08-24 NOTE — Telephone Encounter (Signed)
LMOM notifying pt & requested a return call.

## 2016-08-25 LAB — COMPLETE METABOLIC PANEL WITH GFR
ALK PHOS: 56 U/L (ref 33–130)
ALT: 11 U/L (ref 6–29)
AST: 13 U/L (ref 10–35)
Albumin: 4.4 g/dL (ref 3.6–5.1)
BILIRUBIN TOTAL: 0.9 mg/dL (ref 0.2–1.2)
BUN: 16 mg/dL (ref 7–25)
CALCIUM: 9.2 mg/dL (ref 8.6–10.4)
CO2: 24 mmol/L (ref 20–31)
Chloride: 104 mmol/L (ref 98–110)
Creat: 1.09 mg/dL — ABNORMAL HIGH (ref 0.50–0.99)
GFR, EST AFRICAN AMERICAN: 61 mL/min (ref >=60)
GFR, Est Non African American: 53 mL/min — ABNORMAL LOW (ref >=60)
GLUCOSE: 93 mg/dL (ref 65–99)
Potassium: 4.4 mmol/L (ref 3.5–5.3)
SODIUM: 140 mmol/L (ref 135–146)
TOTAL PROTEIN: 6.8 g/dL (ref 6.1–8.1)

## 2016-08-25 LAB — LIPID PANEL
Cholesterol: 158 mg/dL (ref <200)
HDL: 63 mg/dL (ref >50)
LDL Cholesterol: 77 mg/dL (ref <100)
Total CHOL/HDL Ratio: 2.5 Ratio (ref <5.0)
Triglycerides: 89 mg/dL (ref <150)
VLDL: 18 mg/dL (ref <30)

## 2016-09-03 NOTE — Telephone Encounter (Signed)
Please sent letter

## 2016-09-05 NOTE — Telephone Encounter (Signed)
Letter sent.

## 2016-09-10 ENCOUNTER — Encounter: Payer: Self-pay | Admitting: Family Medicine

## 2016-12-07 LAB — HM MAMMOGRAPHY

## 2016-12-16 ENCOUNTER — Encounter: Payer: Self-pay | Admitting: Family Medicine

## 2017-03-02 ENCOUNTER — Other Ambulatory Visit: Payer: Self-pay | Admitting: Family Medicine

## 2017-08-24 ENCOUNTER — Ambulatory Visit (INDEPENDENT_AMBULATORY_CARE_PROVIDER_SITE_OTHER): Payer: Medicare Other | Admitting: Family Medicine

## 2017-08-24 ENCOUNTER — Encounter: Payer: Self-pay | Admitting: Family Medicine

## 2017-08-24 VITALS — BP 114/63 | HR 70 | Ht 68.0 in | Wt 197.0 lb

## 2017-08-24 DIAGNOSIS — E7800 Pure hypercholesterolemia, unspecified: Secondary | ICD-10-CM

## 2017-08-24 DIAGNOSIS — Z0000 Encounter for general adult medical examination without abnormal findings: Secondary | ICD-10-CM

## 2017-08-24 DIAGNOSIS — R635 Abnormal weight gain: Secondary | ICD-10-CM | POA: Diagnosis not present

## 2017-08-24 DIAGNOSIS — R600 Localized edema: Secondary | ICD-10-CM | POA: Diagnosis not present

## 2017-08-24 DIAGNOSIS — K219 Gastro-esophageal reflux disease without esophagitis: Secondary | ICD-10-CM | POA: Diagnosis not present

## 2017-08-24 DIAGNOSIS — N183 Chronic kidney disease, stage 3 (moderate): Secondary | ICD-10-CM | POA: Diagnosis not present

## 2017-08-24 DIAGNOSIS — N1831 Chronic kidney disease, stage 3a: Secondary | ICD-10-CM

## 2017-08-24 DIAGNOSIS — L299 Pruritus, unspecified: Secondary | ICD-10-CM | POA: Diagnosis not present

## 2017-08-24 NOTE — Patient Instructions (Addendum)
See handout for shingles vaccine.  Even though you have a open I would encourage you to consider getting the new one as is actually much more effective than the old one.  Is 90 to 95% effective and is covered at your pharmacy underneath your Medicare part D.  Welcome to come back and have us look at the skin lesion on your neck any time.  Your next Pap smear is due in September 2021.  In fact at that point based on current guidelines you can opt not to continue with Pap smears.    Food Choices for Gastroesophageal Reflux Disease, Adult When you have gastroesophageal reflux disease (GERD), the foods you eat and your eating habits are very important. Choosing the right foods can help ease the discomfort of GERD. Consider working with a diet and nutrition specialist (dietitian) to help you make healthy food choices. What general guidelines should I follow? Eating plan  Choose healthy foods low in fat, such as fruits, vegetables, whole grains, low-fat dairy products, and lean meat, fish, and poultry.  Eat frequent, small meals instead of three large meals each day. Eat your meals slowly, in a relaxed setting. Avoid bending over or lying down until 2-3 hours after eating.  Limit high-fat foods such as fatty meats or fried foods.  Limit your intake of oils, butter, and shortening to less than 8 teaspoons each day.  Avoid the following: ? Foods that cause symptoms. These may be different for different people. Keep a food diary to keep track of foods that cause symptoms. ? Alcohol. ? Drinking large amounts of liquid with meals. ? Eating meals during the 2-3 hours before bed.  Cook foods using methods other than frying. This may include baking, grilling, or broiling. Lifestyle   Maintain a healthy weight. Ask your health care provider what weight is healthy for you. If you need to lose weight, work with your health care provider to do so safely.  Exercise for at least 30 minutes on 5 or more  days each week, or as told by your health care provider.  Avoid wearing clothes that fit tightly around your waist and chest.  Do not use any products that contain nicotine or tobacco, such as cigarettes and e-cigarettes. If you need help quitting, ask your health care provider.  Sleep with the head of your bed raised. Use a wedge under the mattress or blocks under the bed frame to raise the head of the bed. What foods are not recommended? The items listed may not be a complete list. Talk with your dietitian about what dietary choices are best for you. Grains Pastries or quick breads with added fat. [place] toast. Vegetables Deep fried vegetables. [place] fries. Any vegetables prepared with added fat. Any vegetables that cause symptoms. For some people this may include tomatoes and tomato products, chili peppers, onions and garlic, and horseradish. Fruits Any fruits prepared with added fat. Any fruits that cause symptoms. For some people this may include citrus fruits, such as oranges, grapefruit, pineapple, and lemons. Meats and other protein foods High-fat meats, such as fatty beef or pork, hot dogs, ribs, ham, sausage, salami and bacon. Fried meat or protein, including fried fish and fried chicken. Nuts and nut butters. Dairy Whole milk and chocolate milk. Sour cream. Cream. Ice cream. Cream cheese. Milk shakes. Beverages Coffee and tea, with or without caffeine. Carbonated beverages. Sodas. Energy drinks. Fruit juice made with acidic fruits (such as orange or grapefruit). Tomato juice. Alcoholic drinks. Fats  and oils Butter. Margarine. Shortening. Ghee. Sweets and desserts Chocolate and cocoa. Donuts. Seasoning and other foods Pepper. Peppermint and spearmint. Any condiments, herbs, or seasonings that cause symptoms. For some people, this may include curry, hot sauce, or vinegar-based salad dressings. Summary  When you have gastroesophageal reflux disease (GERD), food and lifestyle  choices are very important to help ease the discomfort of GERD.  Eat frequent, small meals instead of three large meals each day. Eat your meals slowly, in a relaxed setting. Avoid bending over or lying down until 2-3 hours after eating.  Limit high-fat foods such as fatty meat or fried foods. This information is not intended to replace advice given to you by your health care provider. Make sure you discuss any questions you have with your health care provider. Document Released: 02/14/2005 Document Revised: 02/16/2016 Document Reviewed: 02/16/2016 Elsevier Interactive Patient Education  [employer].

## 2017-08-24 NOTE — Progress Notes (Signed)
Subjective:   Alexis Fisher is a 67 y.o. female who presents for Medicare Annual (Subsequent) preventive examination.   She does complain of itchy skin all over.    She also says she has been having more reflux sxs than usual. Would only notice sxs once a month and now more like weekly. Noticed sxs after eating pepperoni.  Has been using Tums and that does seem to help.  She has not been having any problems swallowing.  Food is not been getting stuck.  Usually the Tums works great.  Has also had some lower extremity swelling.  It seems to be worse when she goes to the beach.  Review of Systems:  Comprehensive ros is neg except for HPI.        Objective:     Vitals: BP 114/63   Pulse 70   Ht 5\' 8"  (1.727 m)   Wt 197 lb (89.4 kg)   SpO2 100%   BMI 29.95 kg/m   Body mass index is 29.95 kg/m.  Advanced Directives 08/24/2017 11/06/2014 05/06/2014  Does Patient Have a Medical Advance Directive? Yes No No  Type of Estate agent of [place];Living will - -  Copy of Healthcare Power of Attorney in Chart? No - copy requested - -  Would patient like information on creating a medical advance directive? - No - patient declined information No - patient declined information    Tobacco Social History   Tobacco Use  Smoking Status Former Smoker  . Types: Cigarettes  . Last attempt to quit: 02/28/1998  . Years since quitting: 19.4  Smokeless Tobacco Never Used     Counseling given: Not Answered   Clinical Intake:        Physical Exam  Constitutional: She is oriented to person, place, and time. She appears well-developed and well-nourished.  HENT:  Head: Normocephalic and atraumatic.  Right Ear: External ear normal.  Left Ear: External ear normal.  Nose: Nose normal.  Mouth/Throat: Oropharynx is clear and moist.  TMs and canals are clear.   Eyes: Pupils are equal, round, and reactive to light. Conjunctivae and EOM are normal.  Neck: Neck supple. No  thyromegaly present.  Cardiovascular: Normal rate, regular rhythm and normal heart sounds.  Radial pulse 2+ . Bilat trace ankle edema.    Pulmonary/Chest: Effort normal and breath sounds normal. She has no wheezes.  Lymphadenopathy:    She has no cervical adenopathy.  Neurological: She is alert and oriented to person, place, and time.  Skin: Skin is warm and dry.  Psychiatric: She has a normal mood and affect.                    No past medical history on file. Past Surgical History:  Procedure Laterality Date  . wrsit surgery, plate Left 03/6107   Dr. Dewaine Oats   Family History  Problem Relation Age of Onset  . Kidney failure Father    Social History   Socioeconomic History  . Marital status: Married    Spouse name: Ramon Dredge  . Number of children: 2  . Years of education: Not on file  . Highest education level: Not on file  Occupational History  . Occupation: retired  Engineer, production  . Financial resource strain: Not on file  . Food insecurity:    Worry: Not on file    Inability: Not on file  . Transportation needs:    Medical: Not on file    Non-medical: Not on file  Tobacco Use  . Smoking status: Former Smoker    Types: Cigarettes    Last attempt to quit: 02/28/1998    Years since quitting: 19.4  . Smokeless tobacco: Never Used  Substance and Sexual Activity  . Alcohol use: Yes    Alcohol/week: 2.4 oz    Types: 4 Glasses of wine per week    Comment: wine and beer  . Drug use: No  . Sexual activity: Not Currently    Partners: Male  Lifestyle  . Physical activity:    Days per week: Not on file    Minutes per session: Not on file  . Stress: Not on file  Relationships  . Social connections:    Talks on phone: Not on file    Gets together: Not on file    Attends religious service: Not on file    Active member of club or organization: Not on file    Attends meetings of clubs or organizations: Not on file    Relationship status: Not on file  Other  Topics Concern  . 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 Married to [place] with 2 adult children.    Outpatient Encounter Medications as of 08/24/2017  Medication Sig  . atorvastatin (LIPITOR) 40 MG tablet TAKE ONE TABLET BY MOUTH DAILY AT 6 PM  . cholecalciferol (VITAMIN D) 1000 units tablet Take 1,000 Units by mouth daily.   No facility-administered encounter medications on file as of 08/24/2017.     Activities of Daily Living In your present state of health, do you have any difficulty performing the following activities: 08/24/2017  Hearing? N  Vision? N  Difficulty concentrating or making decisions? N  Walking or climbing stairs? N  Dressing or bathing? N  Doing errands, shopping? N  Some recent data might be hidden    Patient Care Team: Agapito Games, MD as PCP - General (Family Medicine)    Assessment:   This is a routine wellness examination for Anavey.  Exercise Activities and Dietary recommendations Current Exercise Habits: Home exercise routine  Goals    None      Fall Risk Fall Risk  08/24/2017 08/06/2015 06/01/2015  Falls in the past year? No No No   IDepression Screen PHQ 2/9 Scores 08/24/2017 08/06/2015 06/01/2015  PHQ - 2 Score 0 0 0     Cognitive Function     6CIT Screen 08/24/2017 08/23/2016  What Year? 0 points 0 points  What month? 0 points 0 points  What time? 0 points 0 points  Count back from 20 0 points 0 points  Months in reverse 0 points 0 points  Repeat phrase 2 points 2 points  Total Score 2 2    Immunization History  Administered Date(s) Administered  . Influenza, High Dose Seasonal PF 12/25/2013  . Influenza,inj,Quad PF,6+ Mos 11/06/2014  . Pneumococcal Conjugate-13 08/06/2015  . Pneumococcal Polysaccharide-23 08/23/2016  . Tdap 04/03/2007, 11/06/2014  . Zoster 10/11/2010    Qualifies for Shingles Vaccine? Yes  Screening  Tests Health Maintenance  Topic Date Due  . INFLUENZA VACCINE  05/12/2018 (Originally 09/28/2017)  . MAMMOGRAM  12/08/2018  . COLONOSCOPY  02/29/2024  . TETANUS/TDAP  11/05/2024  . DEXA SCAN  Completed  . Hepatitis C Screening  Completed  . PNA vac Low Risk Adult  Completed    Cancer Screenings: Lung: Low Dose CT Chest recommended if Age 16-80 years, 57  pack-year currently smoking OR have quit w/in 15years. Patient does not qualify. Breast:  Up to date on Mammogram? Yes   Up to date of Bone Density/Dexa? Yes Colorectal: UTD  Additional Screenings:  Hepatitis C Screening:      Plan:    Medicare [place]   I have personally reviewed and noted the following in the patient's chart:   . Medical and social history . Use of alcohol, tobacco or illicit drugs  . Current medications and supplements . Functional ability and status . Nutritional status . Physical activity . Advanced directives . List of other physicians . Hospitalizations, surgeries, and ER visits in previous 12 months . Vitals . Screenings to include cognitive, depression, and falls . Referrals and appointments . Weight gain and itchy skin - will check TSH.  Has f/u with Derm in October as well for skin check.  She has a lesion on her neck she asked about today.   Marland Kitchen. GERD-discussed dietary changes and measures to improve her symptoms. . Lower extremity swelling-discussed that several things can contribute including salt intake dietary changes, increased alcohol as well as the heat.  She particularly notices it in the heat.  In addition, I have reviewed and discussed with patient certain preventive protocols, quality metrics, and best practice recommendations. A written personalized care plan for preventive services as well as general preventive health recommendations were provided to patient.     Nani Gasseratherine Annise Boran, MD  08/24/2017

## 2017-08-28 LAB — COMPLETE METABOLIC PANEL WITH GFR
AG Ratio: 2 (calc) (ref 1.0–2.5)
ALT: 11 U/L (ref 6–29)
AST: 13 U/L (ref 10–35)
Albumin: 4.3 g/dL (ref 3.6–5.1)
Alkaline phosphatase (APISO): 50 U/L (ref 33–130)
BUN/Creatinine Ratio: 15 (calc) (ref 6–22)
BUN: 16 mg/dL (ref 7–25)
CALCIUM: 9 mg/dL (ref 8.6–10.4)
CHLORIDE: 107 mmol/L (ref 98–110)
CO2: 26 mmol/L (ref 20–32)
Creat: 1.04 mg/dL — ABNORMAL HIGH (ref 0.50–0.99)
GFR, EST NON AFRICAN AMERICAN: 56 mL/min/{1.73_m2} — AB (ref >=60)
GFR, Est African American: 64 mL/min/{1.73_m2} (ref >=60)
GLUCOSE: 103 mg/dL — AB (ref 65–99)
Globulin: 2.1 g/dL (calc) (ref 1.9–3.7)
Potassium: 4.4 mmol/L (ref 3.5–5.3)
Sodium: 141 mmol/L (ref 135–146)
TOTAL PROTEIN: 6.4 g/dL (ref 6.1–8.1)
Total Bilirubin: 0.6 mg/dL (ref 0.2–1.2)

## 2017-08-28 LAB — LIPID PANEL
Cholesterol: 156 mg/dL (ref <200)
HDL: 54 mg/dL (ref >50)
LDL CHOLESTEROL (CALC): 85 mg/dL
NON-HDL CHOLESTEROL (CALC): 102 mg/dL (ref <130)
TRIGLYCERIDES: 84 mg/dL (ref <150)
Total CHOL/HDL Ratio: 2.9 (calc) (ref <5.0)

## 2017-08-28 LAB — TSH: TSH: 2.02 m[IU]/L (ref 0.40–4.50)

## 2017-09-11 ENCOUNTER — Other Ambulatory Visit: Payer: Self-pay | Admitting: Family Medicine

## 2017-11-29 ENCOUNTER — Other Ambulatory Visit: Payer: Self-pay | Admitting: Family Medicine

## 2018-01-31 ENCOUNTER — Ambulatory Visit (INDEPENDENT_AMBULATORY_CARE_PROVIDER_SITE_OTHER): Payer: Medicare Other | Admitting: Family Medicine

## 2018-01-31 ENCOUNTER — Encounter: Payer: Self-pay | Admitting: Family Medicine

## 2018-01-31 ENCOUNTER — Other Ambulatory Visit: Payer: Self-pay | Admitting: *Deleted

## 2018-01-31 VITALS — BP 135/74 | HR 84 | Ht 65.35 in | Wt 200.0 lb

## 2018-01-31 DIAGNOSIS — J0110 Acute frontal sinusitis, unspecified: Secondary | ICD-10-CM | POA: Diagnosis not present

## 2018-01-31 DIAGNOSIS — M84376A Stress fracture, unspecified foot, initial encounter for fracture: Secondary | ICD-10-CM | POA: Insufficient documentation

## 2018-01-31 MED ORDER — AMOXICILLIN-POT CLAVULANATE 875-125 MG PO TABS: 1.0000 | ORAL_TABLET | Freq: Two times a day (BID) | ORAL | 0 refills | Status: DC

## 2018-01-31 NOTE — Progress Notes (Signed)
Subjective:    Patient ID: Alexis Fisher, female    DOB: 1950/11/07, 67 y.o.   MRN: [id number]  HPI 89 you female c/o of cough and sinus drianage x 13 days. No fever, chills or sweats.  + facial pain and ST.  Neck hurts. She is very fatigue and leaving town.  NO GI sxs.  She has been using ADvil cold and some cough syrup. Helps some.     Review of Systems  BP 135/74   Pulse 84   Ht 5' 5.35" (1.66 m)   Wt 200 lb (90.7 kg)   SpO2 100%   BMI 32.92 kg/m     No Known Allergies  No past medical history on file.  Past Surgical History:  Procedure Laterality Date  . wrsit surgery, plate Left 1/30863/2016   Dr. Dewaine [place]    Social History   Socioeconomic History  . Marital status: Married    Spouse name: Alexis Fisher  . Number of children: 2  . Years of education: Not on file  . Highest education level: Not on file  Occupational History  . Occupation: retired  Engineer, productionocial Needs  . Financial resource strain: Not on file  . Food insecurity:    Worry: Not on file    Inability: Not on file  . Transportation needs:    Medical: Not on file    Non-medical: Not on file  Tobacco Use  . Smoking status: Former Smoker    Types: Cigarettes    Last attempt to quit: 02/28/1998    Years since quitting: 19.9  . Smokeless tobacco: Never Used  Substance and Sexual Activity  . Alcohol use: Yes    Alcohol/week: 4.0 standard drinks    Types: 4 Glasses of wine per week    Comment: wine and beer  . Drug use: No  . Sexual activity: Not Currently    Partners: Male  Lifestyle  . Physical activity:    Days per week: Not on file    Minutes per session: Not on file  . Stress: Not on file  Relationships  . Social connections:    Talks on phone: Not on file    Gets together: Not on file    Attends religious service: Not on file    Active member of club or organization: Not on file    Attends meetings of clubs or organizations: Not on file    Relationship status: Not on file  . Intimate partner  violence:    Fear of current or ex partner: Not on file    Emotionally abused: Not on file    Physically abused: Not on file    Forced sexual activity: Not on file  Other Topics Concern  . 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 Married to [place] with 2 adult children.    Family History  Problem Relation Age of Onset  . Kidney failure Father     Outpatient Encounter Medications as of 01/31/2018  Medication Sig  . atorvastatin (LIPITOR) 40 MG tablet TAKE ONE TABLET BY MOUTH DAILY AT 6 PM  . cholecalciferol (VITAMIN D) 1000 units tablet Take 1,000 Units by mouth daily.  . fluorouracil (EFUDEX) 5 % cream   . amoxicillin-clavulanate (AUGMENTIN) 875-125 MG tablet Take 1 tablet by mouth 2 (two) times daily.   No facility-administered encounter medications on file as of 01/31/2018.  Objective:   Physical Exam  Constitutional: She is oriented to person, place, and time. She appears well-developed and well-nourished.  HENT:  Head: Normocephalic and atraumatic.  Right Ear: External ear normal.  Left Ear: External ear normal.  Nose: Nose normal.  Mouth/Throat: Oropharynx is clear and moist.  TMs and canals are clear.   Eyes: Pupils are equal, round, and reactive to light. Conjunctivae and EOM are normal.  Neck: Neck supple. No thyromegaly present.  Cardiovascular: Normal rate, regular rhythm and normal heart sounds.  Pulmonary/Chest: Effort normal and breath sounds normal. She has no wheezes.  Lymphadenopathy:    She has no cervical adenopathy.  Neurological: She is alert and oriented to person, place, and time.  Skin: Skin is warm and dry.  Psychiatric: She has a normal mood and affect.        Assessment & Plan:  Acute sinusitis - will tx with augmentin.  Call if not better in one week .    Ok ot continue cough syrup.

## 2018-02-14 ENCOUNTER — Telehealth: Payer: Self-pay

## 2018-02-14 NOTE — Telephone Encounter (Signed)
[name] called and left a message stating that she has finished her antibiotic 3-4 days ago and she still has a cough and headache. I called and left a message for her to call back.

## 2018-02-15 NOTE — Telephone Encounter (Signed)
Alexis Fisher reports continued non productive cough and headaches. Denies fever, chills or sweats. She wanted to know if she needs another antibiotic. Please advise.

## 2018-02-16 MED ORDER — AZITHROMYCIN 250 MG PO TABS: ORAL_TABLET | ORAL | 0 refills | Status: AC

## 2018-02-16 NOTE — Addendum Note (Signed)
Addended by: Nani GasserMETHENEY, CATHERINE D on: 02/16/2018 08:24 AM   Modules accepted: Orders

## 2018-02-16 NOTE — Telephone Encounter (Signed)
OK I will send over zpack.

## 2018-02-16 NOTE — Telephone Encounter (Signed)
Left message advising of antibiotic.  

## 2018-03-12 LAB — HM MAMMOGRAPHY

## 2018-03-15 ENCOUNTER — Encounter: Payer: Self-pay | Admitting: Family Medicine

## 2018-11-13 ENCOUNTER — Telehealth: Payer: Self-pay | Admitting: Family Medicine

## 2018-11-13 DIAGNOSIS — N1831 Chronic kidney disease, stage 3a: Secondary | ICD-10-CM

## 2018-11-13 DIAGNOSIS — E7800 Pure hypercholesterolemia, unspecified: Secondary | ICD-10-CM

## 2018-11-13 DIAGNOSIS — Z1329 Encounter for screening for other suspected endocrine disorder: Secondary | ICD-10-CM

## 2018-11-13 DIAGNOSIS — E663 Overweight: Secondary | ICD-10-CM

## 2018-11-13 DIAGNOSIS — Z0000 Encounter for general adult medical examination without abnormal findings: Secondary | ICD-10-CM

## 2018-11-13 NOTE — Telephone Encounter (Signed)
Patient wanted Labs sent downstairs for Physical tomorrow, please advise.

## 2018-11-13 NOTE — Telephone Encounter (Signed)
Please review pended labs.

## 2018-11-13 NOTE — Telephone Encounter (Signed)
Pt advised.

## 2018-11-13 NOTE — Telephone Encounter (Signed)
Labs signed

## 2018-11-14 ENCOUNTER — Other Ambulatory Visit: Payer: Self-pay

## 2018-11-14 ENCOUNTER — Ambulatory Visit (INDEPENDENT_AMBULATORY_CARE_PROVIDER_SITE_OTHER): Payer: Medicare Other | Admitting: Family Medicine

## 2018-11-14 ENCOUNTER — Encounter: Payer: Self-pay | Admitting: Family Medicine

## 2018-11-14 VITALS — BP 124/70 | HR 67 | Temp 97.8°F | Ht 65.0 in | Wt 198.0 lb

## 2018-11-14 DIAGNOSIS — R351 Nocturia: Secondary | ICD-10-CM

## 2018-11-14 DIAGNOSIS — K219 Gastro-esophageal reflux disease without esophagitis: Secondary | ICD-10-CM | POA: Diagnosis not present

## 2018-11-14 DIAGNOSIS — Z23 Encounter for immunization: Secondary | ICD-10-CM | POA: Diagnosis not present

## 2018-11-14 DIAGNOSIS — R319 Hematuria, unspecified: Secondary | ICD-10-CM

## 2018-11-14 DIAGNOSIS — Z0000 Encounter for general adult medical examination without abnormal findings: Secondary | ICD-10-CM | POA: Diagnosis not present

## 2018-11-14 DIAGNOSIS — Z6832 Body mass index (BMI) 32.0-32.9, adult: Secondary | ICD-10-CM

## 2018-11-14 LAB — POCT URINALYSIS DIPSTICK
Bilirubin, UA: NEGATIVE
Glucose, UA: NEGATIVE
Ketones, UA: NEGATIVE
Leukocytes, UA: NEGATIVE
Nitrite, UA: NEGATIVE
Protein, UA: NEGATIVE
Spec Grav, UA: 1.015 (ref 1.010–1.025)
Urobilinogen, UA: 0.2 E.U./dL
pH, UA: 7 (ref 5.0–8.0)

## 2018-11-14 NOTE — Assessment & Plan Note (Addendum)
Will check urinalysis today.  Discussed overactive bladder medications and consider trial for 30 days.  UA was positive for small amount of blood so we will send for microscopic review to verify if she has whole red blood cells present.  No sign of infection.

## 2018-11-14 NOTE — Patient Instructions (Addendum)
Food Choices for Gastroesophageal Reflux Disease, Adult When you have gastroesophageal reflux disease (GERD), the foods you eat and your eating habits are very important. Choosing the right foods can help ease the discomfort of GERD. Consider working with a diet and nutrition specialist (dietitian) to help you make healthy food choices. What general guidelines should I follow?  Eating plan  Choose healthy foods low in fat, such as fruits, vegetables, whole grains, low-fat dairy products, and lean meat, fish, and poultry.  Eat frequent, small meals instead of three large meals each day. Eat your meals slowly, in a relaxed setting. Avoid bending over or lying down until 2-3 hours after eating.  Limit high-fat foods such as fatty meats or fried foods.  Limit your intake of oils, butter, and shortening to less than 8 teaspoons each day.  Avoid the following: ? Foods that cause symptoms. These may be different for different people. Keep a food diary to keep track of foods that cause symptoms. ? Alcohol. ? Drinking large amounts of liquid with meals. ? Eating meals during the 2-3 hours before bed.  Cook foods using methods other than frying. This may include baking, grilling, or broiling. Lifestyle  Maintain a healthy weight. Ask your health care provider what weight is healthy for you. If you need to lose weight, work with your health care provider to do so safely.  Exercise for at least 30 minutes on 5 or more days each week, or as told by your health care provider.  Avoid wearing clothes that fit tightly around your waist and chest.  Do not use any products that contain nicotine or tobacco, such as cigarettes and e-cigarettes. If you need help quitting, ask your health care provider.  Sleep with the head of your bed raised. Use a wedge under the mattress or blocks under the bed frame to raise the head of the bed. What foods are not recommended? The items listed may not be a complete  list. Talk with your dietitian about what dietary choices are best for you. Grains Pastries or quick breads with added fat. French toast. Vegetables Deep fried vegetables. French fries. Any vegetables prepared with added fat. Any vegetables that cause symptoms. For some people this may include tomatoes and tomato products, chili peppers, onions and garlic, and horseradish. Fruits Any fruits prepared with added fat. Any fruits that cause symptoms. For some people this may include citrus fruits, such as oranges, grapefruit, pineapple, and lemons. Meats and other protein foods High-fat meats, such as fatty beef or pork, hot dogs, ribs, ham, sausage, salami and bacon. Fried meat or protein, including fried fish and fried chicken. Nuts and nut butters. Dairy Whole milk and chocolate milk. Sour cream. Cream. Ice cream. Cream cheese. Milk shakes. Beverages Coffee and tea, with or without caffeine. Carbonated beverages. Sodas. Energy drinks. Fruit juice made with acidic fruits (such as orange or grapefruit). Tomato juice. Alcoholic drinks. Fats and oils Butter. Margarine. Shortening. Ghee. Sweets and desserts Chocolate and cocoa. Donuts. Seasoning and other foods Pepper. Peppermint and spearmint. Any condiments, herbs, or seasonings that cause symptoms. For some people, this may include curry, hot sauce, or vinegar-based salad dressings. Summary  When you have gastroesophageal reflux disease (GERD), food and lifestyle choices are very important to help ease the discomfort of GERD.  Eat frequent, small meals instead of three large meals each day. Eat your meals slowly, in a relaxed setting. Avoid bending over or lying down until 2-3 hours after eating.  Limit high-fat   foods such as fatty meat or fried foods. This information is not intended to replace advice given to you by your health care provider. Make sure you discuss any questions you have with your health care provider. Document Released:  02/14/2005 Document Revised: 06/07/2018 Document Reviewed: 02/16/2016 Elsevier Patient Education  2020 [employer] Maintenance for Postmenopausal Women Menopause is a normal process in which your ability to get pregnant comes to an end. This process happens slowly over many months or years, usually between the ages of 28 and 43. Menopause is complete when you have missed your menstrual periods for 12 months. It is important to talk with your health care provider about some of the most common conditions that affect women after menopause (postmenopausal women). These include heart disease, cancer, and bone loss (osteoporosis). Adopting a healthy lifestyle and getting preventive care can help to promote your health and wellness. The actions you take can also lower your chances of developing some of these common conditions. What should I know about menopause? During menopause, you may get a number of symptoms, such as:  Hot flashes. These can be moderate or severe.  Night sweats.  Decrease in sex drive.  Mood swings.  Headaches.  Tiredness.  Irritability.  Memory problems.  Insomnia. Choosing to treat or not to treat these symptoms is a decision that you make with your health care provider. Do I need hormone replacement therapy?  Hormone replacement therapy is effective in treating symptoms that are caused by menopause, such as hot flashes and night sweats.  Hormone replacement carries certain risks, especially as you become older. If you are thinking about using estrogen or estrogen with progestin, discuss the benefits and risks with your health care provider. What is my risk for heart disease and stroke? The risk of heart disease, heart attack, and stroke increases as you age. One of the causes may be a change in the body's hormones during menopause. This can affect how your body uses dietary fats, triglycerides, and cholesterol. Heart attack and stroke are medical  emergencies. There are many things that you can do to help prevent heart disease and stroke. Watch your blood pressure  High blood pressure causes heart disease and increases the risk of stroke. This is more likely to develop in people who have high blood pressure readings, are of African descent, or are overweight.  Have your blood pressure checked: ? Every 3-5 years if you are 77-80 years of age. ? Every year if you are 50 years old or older. Eat a healthy diet   Eat a diet that includes plenty of vegetables, fruits, low-fat dairy products, and lean protein.  Do not eat a lot of foods that are high in solid fats, added sugars, or sodium. Get regular exercise Get regular exercise. This is one of the most important things you can do for your health. Most adults should:  Try to exercise for at least 150 minutes each week. The exercise should increase your heart rate and make you sweat (moderate-intensity exercise).  Try to do strengthening exercises at least twice each week. Do these in addition to the moderate-intensity exercise.  Spend less time sitting. Even light physical activity can be beneficial. Other tips  Work with your health care provider to achieve or maintain a healthy weight.  Do not use any products that contain nicotine or tobacco, such as cigarettes, e-cigarettes, and chewing tobacco. If you need help quitting, ask your health care provider.  Know  your numbers. Ask your health care provider to check your cholesterol and your blood sugar (glucose). Continue to have your blood tested as directed by your health care provider. Do I need screening for cancer? Depending on your health history and family history, you may need to have cancer screening at different stages of your life. This may include screening for:  Breast cancer.  Cervical cancer.  Lung cancer.  Colorectal cancer. What is my risk for osteoporosis? After menopause, you may be at increased risk for  osteoporosis. Osteoporosis is a condition in which bone destruction happens more quickly than new bone creation. To help prevent osteoporosis or the bone fractures that can happen because of osteoporosis, you may take the following actions:  If you are 6719-69 years old, get at least 1,000 mg of calcium and at least 600 mg of vitamin D per day.  If you are older than age 69 but younger than age 69, get at least 1,200 mg of calcium and at least 600 mg of vitamin D per day.  If you are older than age 670, get at least 1,200 mg of calcium and at least 800 mg of vitamin D per day. Smoking and drinking excessive alcohol increase the risk of osteoporosis. Eat foods that are rich in calcium and vitamin D, and do weight-bearing exercises several times each week as directed by your health care provider. How does menopause affect my mental health? Depression may occur at any age, but it is more common as you become older. Common symptoms of depression include:  Low or sad mood.  Changes in sleep patterns.  Changes in appetite or eating patterns.  Feeling an overall lack of motivation or enjoyment of activities that you previously enjoyed.  Frequent crying spells. Talk with your health care provider if you think that you are experiencing depression. General instructions See your health care provider for regular wellness exams and vaccines. This may include:  Scheduling regular health, dental, and eye exams.  Getting and maintaining your vaccines. These include: ? Influenza vaccine. Get this vaccine each year before the flu season begins. ? Pneumonia vaccine. ? Shingles vaccine. ? Tetanus, diphtheria, and pertussis (Tdap) booster vaccine. Your health care provider may also recommend other immunizations. Tell your health care provider if you have ever been abused or do not feel safe at home. Summary  Menopause is a normal process in which your ability to get pregnant comes to an end.  This  condition causes hot flashes, night sweats, decreased interest in sex, mood swings, headaches, or lack of sleep.  Treatment for this condition may include hormone replacement therapy.  Take actions to keep yourself healthy, including exercising regularly, eating a healthy diet, watching your weight, and checking your blood pressure and blood sugar levels.  Get screened for cancer and depression. Make sure that you are up to date with all your vaccines. This information is not intended to replace advice given to you by your health care provider. Make sure you discuss any questions you have with your health care provider. Document Released: 04/08/2005 Document Revised: 02/07/2018 Document Reviewed: 02/07/2018 Elsevier Patient Education  2020 [employer]

## 2018-11-14 NOTE — Assessment & Plan Note (Signed)
Discussed dietary measures and things to avoid.  Additional information handout provided.  If she finds that she is using Tums frequently then she really might need to consider a PPI she could certainly take over-the-counter we could send a prescription.  I do think cutting back on her alcohol intake and greasy spicy foods would be helpful.

## 2018-11-14 NOTE — Assessment & Plan Note (Signed)
BMI 32.  She says she has been walking almost daily with her husband.  He has lost several pounds but she is only lost 2 pounds and is a little bit frustrated.  We discussed strategies around portion control as well as cutting back on alcohol over the next month to see if this makes a difference.

## 2018-11-14 NOTE — Progress Notes (Signed)
Established Patient Office Visit  Subjective:  Patient ID: Alexis Fisher, female    DOB: 11/24/1950  Age: 68 y.o. MRN: [id number]  CC:  Chief Complaint  Patient presents with  . Annual Exam    HPI Alexis Fisher presents for CPE today. She does have a couple issues that she would also like to discuss.   Getting up to urinate 3-4 times a night.  She says she is noticed an increase probably over the last 3 to 6 months.  She denies any hematuria or dysuria.  She reports that sometimes during the day she will occasionally start to leak urine before she can make it to the bathroom so she does occasionally get some urgency.  She says maybe 1 out of 10 times.  She denies any significant issues with stress incontinence.  Has been having more reflux lately. Does drink beer and eats spicey foods.  She does have some nasal congestions as well.  She occasionally uses Tums and that does seem to help.  She does drink alcohol daily.  She did cut out her evening coffee.  She would like to discuss the shingles vaccine today.  She does have a lot of sun damage to her arms and legs but says she has a follow-up with dermatology coming up soon.  History reviewed. No pertinent past medical history.  Past Surgical History:  Procedure Laterality Date  . wrsit surgery, plate Left 06/2839   Dr. Smitty Cords    Family History  Problem Relation Age of Onset  . Kidney failure Father     Social History   Socioeconomic History  . Marital status: Married    Spouse name: Percell Miller  . Number of children: 2  . Years of education: Not on file  . Highest education level: Not on file  Occupational History  . Occupation: retired  Scientific laboratory technician  . Financial resource strain: Not on file  . Food insecurity    Worry: Not on file    Inability: Not on file  . Transportation needs    Medical: Not on file    Non-medical: Not on file  Tobacco Use  . Smoking status: Former Smoker    Types: Cigarettes    Quit date:  02/28/1998    Years since quitting: 20.7  . Smokeless tobacco: Never Used  Substance and Sexual Activity  . Alcohol use: Yes    Alcohol/week: 4.0 standard drinks    Types: 4 Glasses of wine per week    Comment: wine and beer  . Drug use: No  . Sexual activity: Not Currently    Partners: Male  Lifestyle  . Physical activity    Days per week: Not on file    Minutes per session: Not on file  . Stress: Not on file  Relationships  . Social Herbalist on phone: Not on file    Gets together: Not on file    Attends religious service: Not on file    Active member of club or organization: Not on file    Attends meetings of clubs or organizations: Not on file    Relationship status: Not on file  . Intimate partner violence    Fear of current or ex partner: Not on file    Emotionally abused: Not on file    Physically abused: Not on file    Forced sexual activity: Not on file  Other Topics Concern  . 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 Married to [place] Circle[place] with 2 adult children.    Outpatient Medications Prior to Visit  Medication Sig Dispense Refill  . atorvastatin (LIPITOR) 40 MG tablet TAKE ONE TABLET BY MOUTH DAILY AT 6 PM 90 tablet 3  . cholecalciferol (VITAMIN D) 1000 units tablet Take 1,000 Units by mouth daily.    . fluorouracil (EFUDEX) 5 % cream      No facility-administered medications prior to visit.     No Known Allergies  ROS Review of Systems    Objective:    Physical Exam  Constitutional: She is oriented to person, place, and time. She appears well-developed and well-nourished.  HENT:  Head: Normocephalic and atraumatic.  Right Ear: External ear normal.  Left Ear: External ear normal.  Nose: Nose normal.  Mouth/Throat: Oropharynx is clear and moist.  TMs and canals are clear.   Eyes: Pupils are equal, round, and reactive to light. Conjunctivae and  EOM are normal.  Neck: Neck supple. No thyromegaly present.  Cardiovascular: Normal rate, regular rhythm and normal heart sounds.  Pulmonary/Chest: Effort normal and breath sounds normal. She has no wheezes. Right breast exhibits no inverted nipple, no mass, no nipple discharge, no skin change and no tenderness. Left breast exhibits no inverted nipple, no mass, no nipple discharge, no skin change and no tenderness.  Abdominal: Bowel sounds are normal. She exhibits no distension and no mass. There is no abdominal tenderness.  Musculoskeletal:        General: No edema.  Lymphadenopathy:    She has no cervical adenopathy.  Neurological: She is alert and oriented to person, place, and time.  Skin: Skin is warm and dry.  Psychiatric: She has a normal mood and affect. Her behavior is normal.    BP 124/70   Pulse 67   Temp 97.8 F (36.6 C)   Ht 5\' 5"  (1.651 m)   Wt 198 lb (89.8 kg)   SpO2 98%   BMI 32.95 kg/m  Wt Readings from Last 3 Encounters:  11/14/18 198 lb (89.8 kg)  01/31/18 200 lb (90.7 kg)  08/24/17 197 lb (89.4 kg)     Health Maintenance Due  Topic Date Due  . INFLUENZA VACCINE  09/29/2018    There are no preventive care reminders to display for this patient.  Lab Results  Component Value Date   TSH 2.02 08/28/2017   Lab Results  Component Value Date   WBC 4.1 08/23/2016   HGB 13.4 08/23/2016   HCT 40.6 08/23/2016   MCV 86.4 08/23/2016   PLT 204 08/23/2016   Lab Results  Component Value Date   NA 141 08/28/2017   K 4.4 08/28/2017   CO2 26 08/28/2017   GLUCOSE 103 (H) 08/28/2017   BUN 16 08/28/2017   CREATININE 1.04 (H) 08/28/2017   BILITOT 0.6 08/28/2017   ALKPHOS 56 08/23/2016   AST 13 08/28/2017   ALT 11 08/28/2017   PROT 6.4 08/28/2017   ALBUMIN 4.4 08/23/2016   CALCIUM 9.0 08/28/2017   Lab Results  Component Value Date   CHOL 156 08/28/2017   Lab Results  Component Value Date   HDL 54 08/28/2017   Lab Results  Component Value Date    LDLCALC 85 08/28/2017   Lab Results  Component Value Date   TRIG 84 08/28/2017   Lab Results  Component Value Date   CHOLHDL 2.9 08/28/2017   No results found for: HGBA1C  Assessment & Plan:   Problem List Items Addressed This Visit      Digestive   Gastroesophageal reflux disease    Discussed dietary measures and things to avoid.  Additional information handout provided.  If she finds that she is using Tums frequently then she really might need to consider a PPI she could certainly take over-the-counter we could send a prescription.  I do think cutting back on her alcohol intake and greasy spicy foods would be helpful.        Other   Nocturia    Will check urinalysis today.  Discussed overactive bladder medications and consider trial for 30 days.  UA was positive for small amount of blood so we will send for microscopic review to verify if she has whole red blood cells present.  No sign of infection.      Relevant Orders   POCT urinalysis dipstick (Completed)   Urinalysis, microscopic only   BMI 32.0-32.9,adult    BMI 32.  She says she has been walking almost daily with her husband.  He has lost several pounds but she is only lost 2 pounds and is a little bit frustrated.  We discussed strategies around portion control as well as cutting back on alcohol over the next month to see if this makes a difference.       Other Visit Diagnoses    Routine general medical examination at a health care facility    -  Primary   Need for immunization against influenza       Relevant Orders   Flu Vaccine QUAD High Dose(Fluad) (Completed)   Hematuria, unspecified type          Keep up a regular exercise program and make sure you are eating a healthy diet Try to eat 4 servings of dairy a day, or if you are lactose intolerant take a calcium with vitamin D daily.  Your vaccines are up to date.  She we also discussed cutting back on alcohol intake to just the weekends instead of daily.   I think this would really help in her efforts for weight loss as well as reduce her reflux symptoms.  Flu vaccine given today.    No orders of the defined types were placed in this encounter.   Follow-up: No follow-ups on file.    Nani Gasser, MD

## 2018-11-15 LAB — CBC WITH DIFFERENTIAL/PLATELET
Absolute Monocytes: 396 cells/uL (ref 200–950)
Basophils Absolute: 21 cells/uL (ref 0–200)
Basophils Relative: 0.7 %
Eosinophils Absolute: 117 cells/uL (ref 15–500)
Eosinophils Relative: 3.9 %
HCT: 40.4 % (ref 35.0–45.0)
Hemoglobin: 13.1 g/dL (ref 11.7–15.5)
Lymphs Abs: 879 cells/uL (ref 850–3900)
MCH: 28.6 pg (ref 27.0–33.0)
MCHC: 32.4 g/dL (ref 32.0–36.0)
MCV: 88.2 fL (ref 80.0–100.0)
MPV: 9.8 fL (ref 7.5–12.5)
Monocytes Relative: 13.2 %
Neutro Abs: 1587 cells/uL (ref 1500–7800)
Neutrophils Relative %: 52.9 %
Platelets: 182 10*3/uL (ref 140–400)
RBC: 4.58 10*6/uL (ref 3.80–5.10)
RDW: 12.4 % (ref 11.0–15.0)
Total Lymphocyte: 29.3 %
WBC: 3 10*3/uL — ABNORMAL LOW (ref 3.8–10.8)

## 2018-11-15 LAB — COMPLETE METABOLIC PANEL WITH GFR
AG Ratio: 1.9 (calc) (ref 1.0–2.5)
ALT: 15 U/L (ref 6–29)
AST: 18 U/L (ref 10–35)
Albumin: 4.4 g/dL (ref 3.6–5.1)
Alkaline phosphatase (APISO): 51 U/L (ref 37–153)
BUN/Creatinine Ratio: 15 (calc) (ref 6–22)
BUN: 16 mg/dL (ref 7–25)
CO2: 27 mmol/L (ref 20–32)
Calcium: 9.5 mg/dL (ref 8.6–10.4)
Chloride: 106 mmol/L (ref 98–110)
Creat: 1.1 mg/dL — ABNORMAL HIGH (ref 0.50–0.99)
GFR, Est African American: 60 mL/min/{1.73_m2} (ref >=60)
GFR, Est Non African American: 52 mL/min/{1.73_m2} — ABNORMAL LOW (ref >=60)
Globulin: 2.3 g/dL (calc) (ref 1.9–3.7)
Glucose, Bld: 101 mg/dL — ABNORMAL HIGH (ref 65–99)
Potassium: 4.5 mmol/L (ref 3.5–5.3)
Sodium: 141 mmol/L (ref 135–146)
Total Bilirubin: 0.6 mg/dL (ref 0.2–1.2)
Total Protein: 6.7 g/dL (ref 6.1–8.1)

## 2018-11-15 LAB — URINALYSIS, MICROSCOPIC ONLY
Bacteria, UA: NONE SEEN /HPF
Hyaline Cast: NONE SEEN /LPF
RBC / HPF: NONE SEEN /HPF (ref 0–2)
Squamous Epithelial / HPF: NONE SEEN /HPF (ref <=5)
WBC, UA: NONE SEEN /HPF (ref 0–5)

## 2018-11-15 LAB — LIPID PANEL W/REFLEX DIRECT LDL
Cholesterol: 172 mg/dL (ref <200)
HDL: 52 mg/dL (ref >=50)
LDL Cholesterol (Calc): 104 mg/dL (calc) — ABNORMAL HIGH
Non-HDL Cholesterol (Calc): 120 mg/dL (calc) (ref <130)
Total CHOL/HDL Ratio: 3.3 (calc) (ref <5.0)
Triglycerides: 72 mg/dL (ref <150)

## 2018-11-15 MED ORDER — OXYBUTYNIN CHLORIDE ER 5 MG PO TB24: 5.0000 mg | ORAL_TABLET | Freq: Every day | ORAL | 1 refills | Status: DC

## 2018-11-15 NOTE — Addendum Note (Signed)
Addended by: Beatrice Lecher D on: 11/15/2018 09:50 PM   Modules accepted: Orders

## 2018-12-03 ENCOUNTER — Other Ambulatory Visit: Payer: Self-pay | Admitting: Family Medicine

## 2019-03-26 ENCOUNTER — Telehealth: Payer: Self-pay

## 2019-03-26 NOTE — Telephone Encounter (Signed)
Alexis Fisher states she needs a letter stating she is at high risk of complications from COVID-19 and that she needs to stay home until she can receive the vaccine. Please advise.

## 2019-03-27 NOTE — Telephone Encounter (Signed)
OK for later.

## 2019-03-28 NOTE — Telephone Encounter (Signed)
Letter written and printed.

## 2019-03-28 NOTE — Telephone Encounter (Signed)
Patient advised.

## 2019-04-01 DIAGNOSIS — Z1231 Encounter for screening mammogram for malignant neoplasm of breast: Secondary | ICD-10-CM | POA: Diagnosis not present

## 2019-04-01 LAB — HM MAMMOGRAPHY

## 2019-05-09 ENCOUNTER — Encounter: Payer: Self-pay | Admitting: Family Medicine

## 2019-06-20 DIAGNOSIS — Z6830 Body mass index (BMI) 30.0-30.9, adult: Secondary | ICD-10-CM | POA: Diagnosis not present

## 2019-06-20 DIAGNOSIS — E785 Hyperlipidemia, unspecified: Secondary | ICD-10-CM | POA: Diagnosis not present

## 2019-06-20 DIAGNOSIS — R030 Elevated blood-pressure reading, without diagnosis of hypertension: Secondary | ICD-10-CM | POA: Diagnosis not present

## 2019-06-20 DIAGNOSIS — Z87891 Personal history of nicotine dependence: Secondary | ICD-10-CM | POA: Diagnosis not present

## 2019-06-20 DIAGNOSIS — E669 Obesity, unspecified: Secondary | ICD-10-CM | POA: Diagnosis not present

## 2019-07-19 ENCOUNTER — Telehealth: Payer: Self-pay | Admitting: Family Medicine

## 2019-07-19 DIAGNOSIS — M8589 Other specified disorders of bone density and structure, multiple sites: Secondary | ICD-10-CM

## 2019-07-19 NOTE — Telephone Encounter (Signed)
Patient called and is requesting a bone density order to be faxed to [facility] at Fax [phone] and she also would like to know that this has been taken care of

## 2019-07-22 NOTE — Telephone Encounter (Signed)
Order placed [id number]

## 2019-08-05 DIAGNOSIS — M8589 Other specified disorders of bone density and structure, multiple sites: Secondary | ICD-10-CM | POA: Diagnosis not present

## 2019-08-06 ENCOUNTER — Telehealth: Payer: Self-pay | Admitting: Family Medicine

## 2019-08-06 NOTE — Telephone Encounter (Signed)
Call pt: DEXA scan shows normal results.  No sign of osteoporosis. Repeat in 5-10 years.

## 2019-08-07 NOTE — Telephone Encounter (Signed)
Left msg of normal results

## 2019-11-01 ENCOUNTER — Ambulatory Visit (INDEPENDENT_AMBULATORY_CARE_PROVIDER_SITE_OTHER): Payer: Medicare PPO | Admitting: Nurse Practitioner

## 2019-11-01 ENCOUNTER — Encounter: Payer: Self-pay | Admitting: Nurse Practitioner

## 2019-11-01 VITALS — BP 135/79 | HR 77 | Ht 65.0 in | Wt 199.0 lb

## 2019-11-01 DIAGNOSIS — S76311A Strain of muscle, fascia and tendon of the posterior muscle group at thigh level, right thigh, initial encounter: Secondary | ICD-10-CM

## 2019-11-01 DIAGNOSIS — M25473 Effusion, unspecified ankle: Secondary | ICD-10-CM | POA: Diagnosis not present

## 2019-11-01 DIAGNOSIS — G8929 Other chronic pain: Secondary | ICD-10-CM | POA: Insufficient documentation

## 2019-11-01 MED ORDER — CYCLOBENZAPRINE HCL 10 MG PO TABS: 5.0000 mg | ORAL_TABLET | Freq: Three times a day (TID) | ORAL | 2 refills | Status: DC | PRN

## 2019-11-01 MED ORDER — MELOXICAM 7.5 MG PO TABS: 7.5000 mg | ORAL_TABLET | Freq: Every day | ORAL | 0 refills | Status: DC

## 2019-11-01 NOTE — Assessment & Plan Note (Signed)
Bilateral lower extremity edema, not pitting in nature. Most likely related to kidney disease and probably venous insufficiency.  Recommend monitoring to see if the swelling has gone down in the morning when she wakes up.  If so this supports the theory of venous insufficiency. Discussed with her to prop her feet up when she is seated and take frequent breaks throughout the day to prop her feet up. Recommend staying well-hydrated and avoid foods high in sodium. Discussed the option of compression stockings which may be utilized during the day to help reduce inflammation. If symptoms are present in the morning upon wakening or worsen recommend contacting PCP for further evaluation.

## 2019-11-01 NOTE — Assessment & Plan Note (Signed)
Symptoms and presentation consistent with strain of the piriformis muscle on the right hand side of the buttock.  This injury is most likely exacerbated with recent increase in golfing and twisting motion associated with this activity.  In the absence of injury, fall, or evidence of edema erythema or ecchymosis I do not feel it is necessary for imaging at this time and will rather treat conservatively for now. Recommend gentle stretching exercises with the application of ice and heat for 20 minutes at a time at least 3 times a day.  Also recommend that she avoid golfing for at least the next 5 days to allow the muscle to rest and heal. She does have a history of chronic kidney disease so we need to be cautious with NSAID use however given the current injury we will try a short-term of 7.5 mg meloxicam daily to see if this can help with some of her symptoms and prevent her from taking larger doses of other NSAIDs. Recommend muscle relaxant at bedtime.  Did discuss the option that she can try 1/2-1/4 dose during the day if she is experiencing muscle spasms or worsening symptoms however encouraged her to only take this medication when she is home and not when she is out or has to operate machinery or drive. Did discuss the option of a Toradol injection today however the patient would like to wait to see if the other treatment options are effective. Recommend following up next week if symptoms fail to improve with conservative treatment over 5 to 7 days.  At that time we may need to consider imaging or formal physical therapy.

## 2019-11-01 NOTE — Patient Instructions (Signed)
Try the muscle relaxer and meloxicam to help with the pain and inflammation.  I would like you to put ice for 20 minutes and heat for 20 minutes several times a day.  I would like you to use the stretching exercises I gave you on the handouts for the next several days.    Piriformis Syndrome  Piriformis syndrome is a condition that can cause pain and numbness in your buttocks and down the back of your leg. Piriformis syndrome happens when the small muscle that connects the base of your spine to your hip (piriformis muscle) presses on the nerve that runs down the back of your leg (sciatic nerve). The piriformis muscle helps your hip rotate and helps to bring your leg back and out. It also helps shift your weight to keep you stable while you are walking. The sciatic nerve runs under or through the piriformis muscle. Damage to the piriformis muscle can cause spasms that put pressure on the nerve below. This causes pain and discomfort while sitting and moving. The pain may feel as if it begins in the buttock and spreads (radiates) down your hip and thigh. What are the causes? This condition is caused by pressure on the sciatic nerve from the piriformis muscle. The piriformis muscle can get irritated with overuse, especially if other hip muscles are weak and the piriformis muscle has to do extra work. Piriformis syndrome can also occur after an injury, like a fall onto your buttocks. What increases the risk? You are more likely to develop this condition if you:  Are a woman.  Sit for long periods of time.  Are a cyclist.  Have weak buttocks muscles (gluteal muscles). What are the signs or symptoms? Symptoms of this condition include:  Pain, tingling, or numbness that starts in the buttock and runs down the back of your leg (sciatica).  Pain in the groin or thigh area. Your symptoms may get worse:  The longer you sit.  When you walk, run, or climb stairs.  When straining to have a bowel  movement. How is this diagnosed? This condition is diagnosed based on your symptoms, medical history, and physical exam.  During the exam, your health care provider may: ? Move your leg into different positions to check for pain. ? Press on the muscles of your hip and buttock to see if that increases your symptoms.  You may also have tests, including: ? Imaging tests such as X-rays, MRI, or ultrasound. ? Electromyogram (EMG). This test measures electrical signals sent by your nerves into the muscles. ? Nerve conduction study. This test measures how well electrical signals pass through your nerves. How is this treated? This condition may be treated by:  Stopping all activities that cause pain or make your condition worse.  Applying ice or using heat therapy.  Taking medicines to reduce pain and swelling.  Taking a muscle relaxer (muscle relaxant) to stop muscle spasms.  Doing range-of-motion and strengthening exercises (physical therapy) as told by your health care provider.  Massaging the area.  Having acupuncture.  Getting an injection of medicine in the piriformis muscle. Your health care provider will choose the medicine based on your condition. He or she may inject: ? An anti-inflammatory medicine (steroid) to reduce swelling. ? A numbing medicine (local anesthetic) to block the pain. ? Botulinum toxin. The toxin blocks nerve impulses to specific muscles to reduce muscle tension. In rare cases, you may need surgery to cut the muscle and release pressure on the  nerve if other treatments do not work. Follow these instructions at home: Activity  Do not sit for long periods. Get up and walk around every 20 minutes or as often as told by your health care provider. ? When driving long distances, make sure to take frequent stops to get up and stretch.  Use a cushion when you sit on hard surfaces.  Do exercises as told by your health care provider.  Return to your normal  activities as told by your health care provider. Ask your health care provider what activities are safe for you. Managing pain, stiffness, and swelling      If directed, apply heat to the affected area as often as told by your health care provider. Use the heat source that your health care provider recommends, such as a moist heat pack or a heating pad. ? Place a towel between your skin and the heat source. ? Leave the heat on for 20-30 minutes. ? Remove the heat if your skin turns bright red. This is especially important if you are unable to feel pain, heat, or cold. You may have a greater risk of getting burned.  If directed, put ice on the injured area. ? Put ice in a plastic bag. ? Place a towel between your skin and the bag. ? Leave the ice on for 20 minutes, 2-3 times a day. General instructions  Take over-the-counter and prescription medicines only as told by your health care provider.  Ask your health care provider if the medicine prescribed to you requires you to avoid driving or using heavy machinery.  You may need to take actions to prevent or treat constipation, such as: ? Drink enough fluid to keep your urine pale yellow. ? Take over-the-counter or prescription medicines. ? Eat foods that are high in fiber, such as beans, whole grains, and fresh fruits and vegetables. ? Limit foods that are high in fat and processed sugars, such as fried or sweet foods.  Keep all follow-up visits as told by your health care provider. This is important. How is this prevented?  Do not sit for longer than 20 minutes at a time. When you sit, choose padded surfaces.  Warm up and stretch before being active.  Cool down and stretch after being active.  Give your body time to rest between periods of activity.  Make sure to use equipment that fits you.  Maintain physical fitness, including: ? Strength. ? Flexibility. Contact a health care provider if:  Your pain and stiffness continue  or get worse.  Your leg or hip becomes weak.  You have changes in your bowel function or bladder function. Summary  Piriformis syndrome is a condition that can cause pain, tingling, and numbness in your buttocks and down the back of your leg.  You may try applying heat or ice to relieve the pain.  Do not sit for long periods. Get up and walk around every 20 minutes or as often as told by your health care provider. This information is not intended to replace advice given to you by your health care provider. Make sure you discuss any questions you have with your health care provider. Document Revised: 06/07/2018 Document Reviewed: 10/11/2017 Elsevier Patient Education  2020 [employer].

## 2019-11-01 NOTE — Progress Notes (Signed)
Acute Office Visit  Subjective:    Patient ID: Alexis Fisher, female    DOB: [birth year], 69 y.o.   MRN: [id number]  Chief Complaint  Patient presents with  . Back Pain    Back Pain  Patient is in today for tailbone and right buttock pain that started approximately 1 week ago.  She reports the pain first started in the right upper quadrant of the buttock and felt to be muscular in nature after 3 to 4 days she reports the pain felt to settle into the tailbone area on the right side.  She reports the pain is worse when she is walking or moving around.  Sitting on a heating pad relieves the pain.  She does report she is very active she and her husband walk approximately 5 miles several days a week.  She is also very active playing golf and enjoys this activity very much.  She has been playing golf a lot more lately.  She denies any known injury, falls, lifting.  She reports there is no particular radiating pain however once she did feel a sharp pain with radiation while walking up the stairs.  She states the pain is not excruciating but it is at least a 3 out of 10 and makes it very difficult for her to perform the activities that she enjoys in her spare time.  LOWER EXTREMITY EDEMA She reports she has some bilateral lower extremity edema.  She reports this has been ongoing for a while but does feel like the left lower extremity is slightly more edematous than the right.  She reports the edema is definitely worse after she has been on her feet all day long but is unsure if it is completely gone in the morning.  She denies any redness, pain with walking, warmth or tenderness to the calves or ankles.  History reviewed. No pertinent past medical history.  Past Surgical History:  Procedure Laterality Date  . wrsit surgery, plate Left 0/3546   Dr. Dewaine Oats    Family History  Problem Relation Age of Onset  . Kidney failure Father     Social History   Socioeconomic History  . Marital  status: Married    Spouse name: Ramon Dredge  . Number of children: 2  . Years of education: Not on file  . Highest education level: Not on file  Occupational History  . Occupation: retired  Tobacco Use  . Smoking status: Former Smoker    Types: Cigarettes    Quit date: 02/28/1998    Years since quitting: 21.6  . Smokeless tobacco: Never Used  Substance and Sexual Activity  . Alcohol use: Yes    Alcohol/week: 4.0 standard drinks    Types: 4 Glasses of wine per week    Comment: wine and beer  . Drug use: No  . Sexual activity: Not Currently    Partners: Male  Other Topics Concern  . 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 Married to [place] with 2 adult children.   Social Determinants of Health   Financial Resource Strain:   . Difficulty of Paying Living Expenses: Not on file  Food Insecurity:   . Worried About Programme researcher, broadcasting/film/video in the Last Year: Not on file  . Ran Out of Food in the Last Year: Not on file  Transportation Needs:   . Lack of Transportation (Medical): Not on file  .  Lack of Transportation (Non-Medical): Not on file  Physical Activity:   . Days of Exercise per Week: Not on file  . Minutes of Exercise per Session: Not on file  Stress:   . Feeling of Stress : Not on file  Social Connections:   . Frequency of Communication with Friends and Family: Not on file  . Frequency of Social Gatherings with Friends and Family: Not on file  . Attends Religious Services: Not on file  . Active Member of Clubs or Organizations: Not on file  . Attends BankerClub or Organization Meetings: Not on file  . Marital Status: Not on file  Intimate Partner Violence:   . Fear of Current or Ex-Partner: Not on file  . Emotionally Abused: Not on file  . Physically Abused: Not on file  . Sexually Abused: Not on file    Outpatient Medications Prior to Visit  Medication Sig Dispense Refill  .  atorvastatin (LIPITOR) 40 MG tablet TAKE ONE TABLET BY MOUTH DAILY AT 6 PM 90 tablet 3  . cholecalciferol (VITAMIN D) 1000 units tablet Take 1,000 Units by mouth daily.    Marland Kitchen. oxybutynin (DITROPAN XL) 5 MG 24 hr tablet Take 1 tablet (5 mg total) by mouth at bedtime. Ok to increase to 2 tabs after 2 weeks if needed. 45 tablet 1   No facility-administered medications prior to visit.    No Known Allergies     Objective:    Physical Exam Vitals and nursing note reviewed.  Constitutional:      Appearance: Normal appearance.  HENT:     Head: Normocephalic.  Eyes:     Extraocular Movements: Extraocular movements intact.     Conjunctiva/sclera: Conjunctivae normal.     Pupils: Pupils are equal, round, and reactive to light.  Cardiovascular:     Rate and Rhythm: Normal rate and regular rhythm.     Pulses: Normal pulses.          Dorsalis pedis pulses are 2+ on the right side and 2+ on the left side.  Pulmonary:     Effort: Pulmonary effort is normal.     Breath sounds: Normal breath sounds.  Abdominal:     General: Abdomen is flat.     Palpations: Abdomen is soft.  Musculoskeletal:        General: Tenderness present. No swelling.     Cervical back: Normal range of motion. No rigidity or tenderness.     Lumbar back: No swelling or tenderness. Normal range of motion.       Back:     Right lower leg: Edema present.     Left lower leg: Edema present.     Comments: Exacerbation of pain is present with straight leg raise of the right leg.  Negative on the left side.  Feet:     Comments: There is mild bilateral edema in her ankles, nonpitting. Skin:    General: Skin is warm and dry.     Capillary Refill: Capillary refill takes less than 2 seconds.     Findings: No bruising, erythema, lesion or rash.  Neurological:     General: No focal deficit present.     Mental Status: She is alert and oriented to person, place, and time.     Motor: No weakness.     Gait: Gait abnormal.   Psychiatric:        Mood and Affect: Mood normal.        Behavior: Behavior normal.  Thought Content: Thought content normal.        Judgment: Judgment normal.     BP 135/79   Pulse 77   Ht 5\' 5"  (1.651 m)   Wt 199 lb (90.3 kg)   SpO2 95%   BMI 33.12 kg/m  Wt Readings from Last 3 Encounters:  11/01/19 199 lb (90.3 kg)  11/14/18 198 lb (89.8 kg)  01/31/18 200 lb (90.7 kg)    Health Maintenance Due  Topic Date Due  . INFLUENZA VACCINE  09/29/2019    There are no preventive care reminders to display for this patient.   Lab Results  Component Value Date   TSH 2.02 08/28/2017   Lab Results  Component Value Date   WBC 3.0 (L) 11/14/2018   HGB 13.1 11/14/2018   HCT 40.4 11/14/2018   MCV 88.2 11/14/2018   PLT 182 11/14/2018   Lab Results  Component Value Date   NA 141 11/14/2018   K 4.5 11/14/2018   CO2 27 11/14/2018   GLUCOSE 101 (H) 11/14/2018   BUN 16 11/14/2018   CREATININE 1.10 (H) 11/14/2018   BILITOT 0.6 11/14/2018   ALKPHOS 56 08/23/2016   AST 18 11/14/2018   ALT 15 11/14/2018   PROT 6.7 11/14/2018   ALBUMIN 4.4 08/23/2016   CALCIUM 9.5 11/14/2018   Lab Results  Component Value Date   CHOL 172 11/14/2018   Lab Results  Component Value Date   HDL 52 11/14/2018   Lab Results  Component Value Date   LDLCALC 104 (H) 11/14/2018   Lab Results  Component Value Date   TRIG 72 11/14/2018   Lab Results  Component Value Date   CHOLHDL 3.3 11/14/2018   No results found for: HGBA1C     Assessment & Plan:   Problem List Items Addressed This Visit      Musculoskeletal and Integument   Strain of piriformis muscle, right, initial encounter - Primary    Symptoms and presentation consistent with strain of the piriformis muscle on the right hand side of the buttock.  This injury is most likely exacerbated with recent increase in golfing and twisting motion associated with this activity.  In the absence of injury, fall, or evidence of edema  erythema or ecchymosis I do not feel it is necessary for imaging at this time and will rather treat conservatively for now. Recommend gentle stretching exercises with the application of ice and heat for 20 minutes at a time at least 3 times a day.  Also recommend that she avoid golfing for at least the next 5 days to allow the muscle to rest and heal. She does have a history of chronic kidney disease so we need to be cautious with NSAID use however given the current injury we will try a short-term of 7.5 mg meloxicam daily to see if this can help with some of her symptoms and prevent her from taking larger doses of other NSAIDs. Recommend muscle relaxant at bedtime.  Did discuss the option that she can try 1/2-1/4 dose during the day if she is experiencing muscle spasms or worsening symptoms however encouraged her to only take this medication when she is home and not when she is out or has to operate machinery or drive. Did discuss the option of a Toradol injection today however the patient would like to wait to see if the other treatment options are effective. Recommend following up next week if symptoms fail to improve with conservative treatment over 5 to 7  days.  At that time we may need to consider imaging or formal physical therapy.      Relevant Medications   cyclobenzaprine (FLEXERIL) 10 MG tablet   meloxicam (MOBIC) 7.5 MG tablet     Other   Mild ankle edema    Bilateral lower extremity edema, not pitting in nature. Most likely related to kidney disease and probably venous insufficiency.  Recommend monitoring to see if the swelling has gone down in the morning when she wakes up.  If so this supports the theory of venous insufficiency. Discussed with her to prop her feet up when she is seated and take frequent breaks throughout the day to prop her feet up. Recommend staying well-hydrated and avoid foods high in sodium. Discussed the option of compression stockings which may be utilized  during the day to help reduce inflammation. If symptoms are present in the morning upon wakening or worsen recommend contacting PCP for further evaluation.          Meds ordered this encounter  Medications  . cyclobenzaprine (FLEXERIL) 10 MG tablet    Sig: Take 0.5-1 tablets (5-10 mg total) by mouth 3 (three) times daily as needed for muscle spasms. Caution: can cause drowsiness    Dispense:  60 tablet    Refill:  2  . meloxicam (MOBIC) 7.5 MG tablet    Sig: Take 1 tablet (7.5 mg total) by mouth daily.    Dispense:  30 tablet    Refill:  0   Follow-up if symptoms worsen or fail to improve.  Tollie Eth, NP

## 2019-11-15 ENCOUNTER — Other Ambulatory Visit: Payer: Self-pay

## 2019-11-15 ENCOUNTER — Ambulatory Visit (INDEPENDENT_AMBULATORY_CARE_PROVIDER_SITE_OTHER): Payer: Medicare PPO | Admitting: Family Medicine

## 2019-11-15 ENCOUNTER — Encounter: Payer: Self-pay | Admitting: Family Medicine

## 2019-11-15 VITALS — BP 122/74 | HR 87 | Ht 65.0 in | Wt 197.0 lb

## 2019-11-15 DIAGNOSIS — E7800 Pure hypercholesterolemia, unspecified: Secondary | ICD-10-CM | POA: Diagnosis not present

## 2019-11-15 DIAGNOSIS — Z0000 Encounter for general adult medical examination without abnormal findings: Secondary | ICD-10-CM | POA: Diagnosis not present

## 2019-11-15 DIAGNOSIS — N1831 Chronic kidney disease, stage 3a: Secondary | ICD-10-CM | POA: Diagnosis not present

## 2019-11-15 DIAGNOSIS — E663 Overweight: Secondary | ICD-10-CM

## 2019-11-15 DIAGNOSIS — Z23 Encounter for immunization: Secondary | ICD-10-CM | POA: Diagnosis not present

## 2019-11-15 NOTE — Progress Notes (Signed)
Subjective:     Alexis Fisher is a 69 y.o. female and is here for a comprehensive physical exam. The patient reports no problems.  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  She has been trying to walk 5 miles per day.  Social History   Socioeconomic History  . Marital status: Married    Spouse name: Ramon Dredge  . Number of children: 2  . Years of education: Not on file  . Highest education level: Not on file  Occupational History  . Occupation: retired  Tobacco Use  . Smoking status: Former Smoker    Types: Cigarettes    Quit date: 02/28/1998    Years since quitting: 21.7  . Smokeless tobacco: Never Used  Substance and Sexual Activity  . Alcohol use: Yes    Alcohol/week: 4.0 standard drinks    Types: 4 Glasses of wine per week    Comment: wine and beer  . Drug use: No  . Sexual activity: Not Currently    Partners: Male  Other Topics Concern  . 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   Social Determinants of Health   Financial Resource Strain:   . Difficulty of Paying Living Expenses: Not on file  Food Insecurity:   . Worried About Programme researcher, broadcasting/film/video in the Last Year: Not on file  . Ran Out of Food in the Last Year: Not on file  Transportation Needs:   . Lack of Transportation (Medical): Not on file  . Lack of Transportation (Non-Medical): Not on file  Physical Activity:   . Days of Exercise per Week: Not on file  . Minutes of Exercise per Session: Not on file  Stress:   . Feeling of Stress : Not on file  Social Connections:   . Frequency of Communication with Friends and Family: Not on file  . Frequency of Social Gatherings with Friends and Family: Not on file  . Attends Religious Services: Not on file  . Active Member of Clubs or  Organizations: Not on file  . Attends Banker Meetings: Not on file  . Marital Status: Not on file  Intimate Partner Violence:   . Fear of Current or Ex-Partner: Not on file  . Emotionally Abused: Not on file  . Physically Abused: Not on file  . Sexually Abused: Not on file   Health Maintenance  Topic Date Due  . MAMMOGRAM  03/31/2021  . COLONOSCOPY  02/29/2024  . TETANUS/TDAP  11/05/2024  . INFLUENZA VACCINE  Completed  . DEXA SCAN  Completed  . COVID-19 Vaccine  Completed  . Hepatitis C Screening  Completed  . PNA vac Low Risk Adult  Completed    The following portions of the patient's history were reviewed and updated as appropriate: allergies, current medications, past family history, past medical history, past social history, past surgical history and problem list.  Review of Systems A comprehensive review of systems was negative.   Objective:    BP 122/74   Pulse 87   Ht 5\' 5"  (1.651 m)   Wt 197 lb (89.4 kg)   SpO2 100%   BMI 32.78 kg/m  General appearance: alert, cooperative and appears stated age Head: Normocephalic, without obvious abnormality, atraumatic Eyes: conj clear,  EOMi, PEERLA Ears: normal TM's and external ear canals both ears Nose: Nares normal. Septum midline. Mucosa normal. No drainage or sinus tenderness. Throat: lips, mucosa, and tongue normal; teeth and gums normal Neck: no adenopathy, no carotid bruit, no JVD, supple, symmetrical, trachea midline and thyroid not enlarged, symmetric, no tenderness/mass/nodules Back: symmetric, no curvature. ROM normal. No CVA tenderness. Lungs: clear to auscultation bilaterally Heart: regular rate and rhythm, S1, S2 normal, no murmur, click, rub or gallop Abdomen: soft, non-tender; bowel sounds normal; no masses,  no organomegaly Extremities: extremities normal, atraumatic, no cyanosis or edema Pulses: 2+ and symmetric Skin: Skin color, texture, turgor normal. No rashes or lesions Lymph nodes:  Cervical adenopathy: nl and Supraclavicular adenopathy: nl Neurologic: Alert and oriented X 3, normal strength and tone. Normal symmetric reflexes. Normal coordination and gait    Assessment:    Healthy female exam.     Plan:     See After Visit Summary for Counseling Recommendations   Keep up a regular exercise program and make sure you are eating a healthy diet Try to eat 4 servings of dairy a day, or if you are lactose intolerant take a calcium with vitamin D daily.  Your vaccines are up to date.  Mammo up to date.

## 2019-11-15 NOTE — Patient Instructions (Signed)
Health Maintenance After Age 70 After age 70, you are at a higher risk for certain long-term diseases and infections as well as injuries from falls. Falls are a major cause of broken bones and head injuries in people who are older than age 70. Getting regular preventive care can help to keep you healthy and well. Preventive care includes getting regular testing and making lifestyle changes as recommended by your health care provider. Talk with your health care provider about:  Which screenings and tests you should have. A screening is a test that checks for a disease when you have no symptoms.  A diet and exercise plan that is right for you. What should I know about screenings and tests to prevent falls? Screening and testing are the best ways to find a health problem early. Early diagnosis and treatment give you the best chance of managing medical conditions that are common after age 70. Certain conditions and lifestyle choices may make you more likely to have a fall. Your health care provider may recommend:  Regular vision checks. Poor vision and conditions such as cataracts can make you more likely to have a fall. If you wear glasses, make sure to get your prescription updated if your vision changes.  Medicine review. Work with your health care provider to regularly review all of the medicines you are taking, including over-the-counter medicines. Ask your health care provider about any side effects that may make you more likely to have a fall. Tell your health care provider if any medicines that you take make you feel dizzy or sleepy.  Osteoporosis screening. Osteoporosis is a condition that causes the bones to get weaker. This can make the bones weak and cause them to break more easily.  Blood pressure screening. Blood pressure changes and medicines to control blood pressure can make you feel dizzy.  Strength and balance checks. Your health care provider may recommend certain tests to check your  strength and balance while standing, walking, or changing positions.  Foot health exam. Foot pain and numbness, as well as not wearing proper footwear, can make you more likely to have a fall.  Depression screening. You may be more likely to have a fall if you have a fear of falling, feel emotionally low, or feel unable to do activities that you used to do.  Alcohol use screening. Using too much alcohol can affect your balance and may make you more likely to have a fall. What actions can I take to lower my risk of falls? General instructions  Talk with your health care provider about your risks for falling. Tell your health care provider if: ? You fall. Be sure to tell your health care provider about all falls, even ones that seem minor. ? You feel dizzy, sleepy, or off-balance.  Take over-the-counter and prescription medicines only as told by your health care provider. These include any supplements.  Eat a healthy diet and maintain a healthy weight. A healthy diet includes low-fat dairy products, low-fat (lean) meats, and fiber from whole grains, beans, and lots of fruits and vegetables. Home safety  Remove any tripping hazards, such as rugs, cords, and clutter.  Install safety equipment such as grab bars in bathrooms and safety rails on stairs.  Keep rooms and walkways well-lit. Activity   Follow a regular exercise program to stay fit. This will help you maintain your balance. Ask your health care provider what types of exercise are appropriate for you.  If you need a cane or   walker, use it as recommended by your health care provider.  Wear supportive shoes that have nonskid soles. Lifestyle  Do not drink alcohol if your health care provider tells you not to drink.  If you drink alcohol, limit how much you have: ? 0-1 drink a day for women. ? 0-2 drinks a day for men.  Be aware of how much alcohol is in your drink. In the [place] one drink equals one typical bottle of beer (12  oz), one-half glass of wine (5 oz), or one shot of hard liquor (1 oz).  Do not use any products that contain nicotine or tobacco, such as cigarettes and e-cigarettes. If you need help quitting, ask your health care provider. Summary  Having a healthy lifestyle and getting preventive care can help to protect your health and wellness after age 70.  Screening and testing are the best way to find a health problem early and help you avoid having a fall. Early diagnosis and treatment give you the best chance for managing medical conditions that are more common for people who are older than age 70.  Falls are a major cause of broken bones and head injuries in people who are older than age 70. Take precautions to prevent a fall at home.  Work with your health care provider to learn what changes you can make to improve your health and wellness and to prevent falls. This information is not intended to replace advice given to you by your health care provider. Make sure you discuss any questions you have with your health care provider. Document Revised: 06/07/2018 Document Reviewed: 12/28/2016 Elsevier Patient Education  2020 Elsevier Inc.  

## 2019-11-19 ENCOUNTER — Other Ambulatory Visit: Payer: Self-pay | Admitting: *Deleted

## 2019-11-19 DIAGNOSIS — E663 Overweight: Secondary | ICD-10-CM | POA: Diagnosis not present

## 2019-11-19 DIAGNOSIS — N1831 Chronic kidney disease, stage 3a: Secondary | ICD-10-CM | POA: Diagnosis not present

## 2019-11-19 DIAGNOSIS — E7800 Pure hypercholesterolemia, unspecified: Secondary | ICD-10-CM | POA: Diagnosis not present

## 2019-11-19 DIAGNOSIS — Z0000 Encounter for general adult medical examination without abnormal findings: Secondary | ICD-10-CM | POA: Diagnosis not present

## 2019-11-19 MED ORDER — ATORVASTATIN CALCIUM 40 MG PO TABS
40.0000 mg | ORAL_TABLET | Freq: Every day | ORAL | 3 refills | Status: DC
Start: 2019-11-19 — End: 2020-01-16

## 2019-11-20 LAB — COMPLETE METABOLIC PANEL WITH GFR
AG Ratio: 1.7 (calc) (ref 1.0–2.5)
ALT: 11 U/L (ref 6–29)
AST: 14 U/L (ref 10–35)
Albumin: 4.1 g/dL (ref 3.6–5.1)
Alkaline phosphatase (APISO): 52 U/L (ref 37–153)
BUN/Creatinine Ratio: 17 (calc) (ref 6–22)
BUN: 18 mg/dL (ref 7–25)
CO2: 28 mmol/L (ref 20–32)
Calcium: 9 mg/dL (ref 8.6–10.4)
Chloride: 104 mmol/L (ref 98–110)
Creat: 1.04 mg/dL — ABNORMAL HIGH (ref 0.50–0.99)
GFR, Est African American: 63 mL/min/{1.73_m2} (ref >=60)
GFR, Est Non African American: 55 mL/min/{1.73_m2} — ABNORMAL LOW (ref >=60)
Globulin: 2.4 g/dL (calc) (ref 1.9–3.7)
Glucose, Bld: 88 mg/dL (ref 65–99)
Potassium: 4.4 mmol/L (ref 3.5–5.3)
Sodium: 139 mmol/L (ref 135–146)
Total Bilirubin: 0.6 mg/dL (ref 0.2–1.2)
Total Protein: 6.5 g/dL (ref 6.1–8.1)

## 2019-11-20 LAB — CBC
HCT: 39.9 % (ref 35.0–45.0)
Hemoglobin: 13 g/dL (ref 11.7–15.5)
MCH: 28.7 pg (ref 27.0–33.0)
MCHC: 32.6 g/dL (ref 32.0–36.0)
MCV: 88.1 fL (ref 80.0–100.0)
MPV: 9.8 fL (ref 7.5–12.5)
Platelets: 186 10*3/uL (ref 140–400)
RBC: 4.53 10*6/uL (ref 3.80–5.10)
RDW: 11.9 % (ref 11.0–15.0)
WBC: 2.6 10*3/uL — ABNORMAL LOW (ref 3.8–10.8)

## 2019-11-20 LAB — LIPID PANEL
Cholesterol: 159 mg/dL (ref <200)
HDL: 51 mg/dL (ref >=50)
LDL Cholesterol (Calc): 90 mg/dL (calc)
Non-HDL Cholesterol (Calc): 108 mg/dL (calc) (ref <130)
Total CHOL/HDL Ratio: 3.1 (calc) (ref <5.0)
Triglycerides: 88 mg/dL (ref <150)

## 2019-11-20 LAB — HEMOGLOBIN A1C
Hgb A1c MFr Bld: 5.3 % of total Hgb (ref <5.7)
Mean Plasma Glucose: 105 (calc)
eAG (mmol/L): 5.8 (calc)

## 2019-11-22 ENCOUNTER — Other Ambulatory Visit: Payer: Self-pay | Admitting: *Deleted

## 2019-11-22 DIAGNOSIS — D729 Disorder of white blood cells, unspecified: Secondary | ICD-10-CM

## 2019-11-22 DIAGNOSIS — Z9889 Other specified postprocedural states: Secondary | ICD-10-CM

## 2019-11-29 DIAGNOSIS — D729 Disorder of white blood cells, unspecified: Secondary | ICD-10-CM | POA: Diagnosis not present

## 2019-11-30 LAB — CBC WITH DIFFERENTIAL/PLATELET
Absolute Monocytes: 370 cells/uL (ref 200–950)
Basophils Absolute: 30 cells/uL (ref 0–200)
Basophils Relative: 0.8 %
Eosinophils Absolute: 81 cells/uL (ref 15–500)
Eosinophils Relative: 2.2 %
HCT: 39.5 % (ref 35.0–45.0)
Hemoglobin: 13 g/dL (ref 11.7–15.5)
Lymphs Abs: 958 cells/uL (ref 850–3900)
MCH: 28.6 pg (ref 27.0–33.0)
MCHC: 32.9 g/dL (ref 32.0–36.0)
MCV: 86.8 fL (ref 80.0–100.0)
MPV: 10 fL (ref 7.5–12.5)
Monocytes Relative: 10 %
Neutro Abs: 2261 cells/uL (ref 1500–7800)
Neutrophils Relative %: 61.1 %
Platelets: 191 10*3/uL (ref 140–400)
RBC: 4.55 10*6/uL (ref 3.80–5.10)
RDW: 11.8 % (ref 11.0–15.0)
Total Lymphocyte: 25.9 %
WBC: 3.7 10*3/uL — ABNORMAL LOW (ref 3.8–10.8)

## 2019-12-11 DIAGNOSIS — L570 Actinic keratosis: Secondary | ICD-10-CM | POA: Diagnosis not present

## 2019-12-11 DIAGNOSIS — D225 Melanocytic nevi of trunk: Secondary | ICD-10-CM | POA: Diagnosis not present

## 2019-12-11 DIAGNOSIS — D485 Neoplasm of uncertain behavior of skin: Secondary | ICD-10-CM | POA: Diagnosis not present

## 2019-12-11 DIAGNOSIS — D0461 Carcinoma in situ of skin of right upper limb, including shoulder: Secondary | ICD-10-CM | POA: Diagnosis not present

## 2019-12-11 DIAGNOSIS — X32XXXA Exposure to sunlight, initial encounter: Secondary | ICD-10-CM | POA: Diagnosis not present

## 2019-12-11 DIAGNOSIS — L578 Other skin changes due to chronic exposure to nonionizing radiation: Secondary | ICD-10-CM | POA: Diagnosis not present

## 2019-12-11 DIAGNOSIS — L986 Other infiltrative disorders of the skin and subcutaneous tissue: Secondary | ICD-10-CM | POA: Diagnosis not present

## 2019-12-11 DIAGNOSIS — L821 Other seborrheic keratosis: Secondary | ICD-10-CM | POA: Diagnosis not present

## 2020-01-15 ENCOUNTER — Other Ambulatory Visit: Payer: Self-pay | Admitting: Family Medicine

## 2020-02-10 DIAGNOSIS — X32XXXA Exposure to sunlight, initial encounter: Secondary | ICD-10-CM | POA: Diagnosis not present

## 2020-02-10 DIAGNOSIS — D0461 Carcinoma in situ of skin of right upper limb, including shoulder: Secondary | ICD-10-CM | POA: Diagnosis not present

## 2020-02-10 DIAGNOSIS — L578 Other skin changes due to chronic exposure to nonionizing radiation: Secondary | ICD-10-CM | POA: Diagnosis not present

## 2020-02-10 DIAGNOSIS — L570 Actinic keratosis: Secondary | ICD-10-CM | POA: Diagnosis not present

## 2020-05-15 ENCOUNTER — Encounter: Payer: Medicare PPO | Admitting: Sports Medicine

## 2020-05-19 ENCOUNTER — Other Ambulatory Visit: Payer: Self-pay

## 2020-05-19 ENCOUNTER — Ambulatory Visit (INDEPENDENT_AMBULATORY_CARE_PROVIDER_SITE_OTHER): Payer: Medicare PPO

## 2020-05-19 ENCOUNTER — Ambulatory Visit (INDEPENDENT_AMBULATORY_CARE_PROVIDER_SITE_OTHER): Payer: Medicare PPO | Admitting: Sports Medicine

## 2020-05-19 DIAGNOSIS — G8929 Other chronic pain: Secondary | ICD-10-CM

## 2020-05-19 DIAGNOSIS — M533 Sacrococcygeal disorders, not elsewhere classified: Secondary | ICD-10-CM

## 2020-05-19 DIAGNOSIS — M545 Low back pain, unspecified: Secondary | ICD-10-CM | POA: Diagnosis not present

## 2020-05-19 DIAGNOSIS — M47818 Spondylosis without myelopathy or radiculopathy, sacral and sacrococcygeal region: Secondary | ICD-10-CM | POA: Diagnosis not present

## 2020-05-19 DIAGNOSIS — M47816 Spondylosis without myelopathy or radiculopathy, lumbar region: Secondary | ICD-10-CM | POA: Diagnosis not present

## 2020-05-19 MED ORDER — DICLOFENAC SODIUM 75 MG PO TBEC: 75.0000 mg | DELAYED_RELEASE_TABLET | Freq: Two times a day (BID) | ORAL | 3 refills | Status: AC

## 2020-05-19 NOTE — Progress Notes (Signed)
    Procedures performed today:    None.  Independent interpretation of notes and tests performed by another provider:   None.  Brief History, Exam, Impression, and Recommendations:    Chronic right sacroiliac joint pain Alexis Fisher is a very pleasant 70 year old female, she has a long history of back pain, right-sided with radiation into the buttock but not past the knee. Worse with standing, not so much with sleeping, Valsalva, flexion. Nothing overtly radicular, she does have significant tenderness over the right sacroiliac joint. I explained to her the difficulty in diagnosing the exact structure that is causing pain in the first visit though it does look like a right SI joint. Switching from low-dose Mobic to Voltaren 75 twice daily, she can continue arthritis with Tylenol as needed, x-rays of her lumbar spine, SI joints, home rehab exercises. We also went over the anatomy and evolutionary changes involved in back pain. Return to see me in 4 weeks and we will consider advancing to formal PT if not better versus proceeding straight to injection. She does understand that we may need to inject a few structures such as the facet joints and/or discs before we have the diagnosis.    ___________________________________________ [name]. Benjamin Stain, M.D., ABFM., CAQSM. Primary Care and Sports Medicine [facility]  Adjunct Instructor of Family Medicine  [employer]

## 2020-05-19 NOTE — Assessment & Plan Note (Addendum)
Alexis Fisher is a very pleasant 70 year old female, she has a long history of back pain, right-sided with radiation into the buttock but not past the knee. Worse with standing, not so much with sleeping, Valsalva, flexion. Nothing overtly radicular, she does have significant tenderness over the right sacroiliac joint. I explained to her the difficulty in diagnosing the exact structure that is causing pain in the first visit though it does look like a right SI joint. Switching from low-dose Mobic to Voltaren 75 twice daily, she can continue arthritis with Tylenol as needed, x-rays of her lumbar spine, SI joints, home rehab exercises. We also went over the anatomy and evolutionary changes involved in back pain. Return to see me in 4 weeks and we will consider advancing to formal PT if not better versus proceeding straight to injection. She does understand that we may need to inject a few structures such as the facet joints and/or discs before we have the diagnosis.

## 2020-06-15 ENCOUNTER — Other Ambulatory Visit: Payer: Self-pay

## 2020-06-15 ENCOUNTER — Ambulatory Visit (INDEPENDENT_AMBULATORY_CARE_PROVIDER_SITE_OTHER): Payer: Medicare PPO | Admitting: Sports Medicine

## 2020-06-15 DIAGNOSIS — G8929 Other chronic pain: Secondary | ICD-10-CM | POA: Diagnosis not present

## 2020-06-15 DIAGNOSIS — M533 Sacrococcygeal disorders, not elsewhere classified: Secondary | ICD-10-CM | POA: Diagnosis not present

## 2020-06-15 NOTE — Assessment & Plan Note (Signed)
This is a pleasant 70 year old female with a long history of low back pain, right-sided, radiation into the buttock but not past the knee, worse with standing but not so much worse with Valsalva, flexion, laying in bed. Nothing overtly radicular, at the last visit she had direct tenderness over her right sacroiliac joint, we switched from low-dose Mobic to Voltaren 75 twice a day and added SI joint rehab exercises, returns today essentially pain-free, return as needed.

## 2020-06-15 NOTE — Progress Notes (Signed)
    Procedures performed today:    None.  Independent interpretation of notes and tests performed by another provider:   None.  Brief History, Exam, Impression, and Recommendations:    Chronic right sacroiliac joint pain This is a pleasant 70 year old female with a long history of low back pain, right-sided, radiation into the buttock but not past the knee, worse with standing but not so much worse with Valsalva, flexion, laying in bed. Nothing overtly radicular, at the last visit she had direct tenderness over her right sacroiliac joint, we switched from low-dose Mobic to Voltaren 75 twice a day and added SI joint rehab exercises, returns today essentially pain-free, return as needed.    ___________________________________________ [name]. Benjamin Stain, M.D., ABFM., CAQSM. Primary Care and Sports Medicine [facility]  Adjunct Instructor of Family Medicine  [employer]

## 2020-06-17 DIAGNOSIS — Z20822 Contact with and (suspected) exposure to covid-19: Secondary | ICD-10-CM | POA: Diagnosis not present

## 2020-08-02 DIAGNOSIS — J029 Acute pharyngitis, unspecified: Secondary | ICD-10-CM | POA: Diagnosis not present

## 2020-08-02 DIAGNOSIS — R6883 Chills (without fever): Secondary | ICD-10-CM | POA: Diagnosis not present

## 2020-08-02 DIAGNOSIS — U071 COVID-19: Secondary | ICD-10-CM | POA: Diagnosis not present

## 2020-08-20 ENCOUNTER — Encounter: Payer: Self-pay | Admitting: Family Medicine

## 2020-08-20 NOTE — Telephone Encounter (Signed)
Error

## 2020-10-02 DIAGNOSIS — Z1231 Encounter for screening mammogram for malignant neoplasm of breast: Secondary | ICD-10-CM | POA: Diagnosis not present

## 2020-10-02 LAB — HM MAMMOGRAPHY

## 2020-10-08 ENCOUNTER — Encounter: Payer: Self-pay | Admitting: Family Medicine

## 2020-10-23 ENCOUNTER — Other Ambulatory Visit: Payer: Self-pay

## 2020-10-23 ENCOUNTER — Ambulatory Visit (INDEPENDENT_AMBULATORY_CARE_PROVIDER_SITE_OTHER): Payer: Medicare PPO | Admitting: Family Medicine

## 2020-10-23 VITALS — BP 127/71 | HR 74 | Ht 67.5 in | Wt 201.0 lb

## 2020-10-23 DIAGNOSIS — Z0000 Encounter for general adult medical examination without abnormal findings: Secondary | ICD-10-CM

## 2020-10-23 NOTE — Progress Notes (Signed)
MEDICARE ANNUAL WELLNESS VISIT  10/23/2020  Subjective:  Alexis Fisher is a 70 y.o. female patient of Alexis Fisher, Alexis Ehlers, Alexis Fisher who had a Medicare Annual Wellness Visit today. Alexis Fisher part time and lives with Alexis Fisher. Alexis Fisher has Alexis Fisher. Alexis Fisher reports that Alexis Fisher is socially active and does interact with friends/family regularly. Alexis Fisher is moderately physically active and enjoys playing golf, pickle ball, walking and reading.  Patient Care Team: Alexis Games, Alexis Fisher as PCP - General (Family Medicine)  Advanced Directives 10/23/2020 11/14/2018 08/24/2017 11/06/2014 05/06/2014  Does Patient Have a Medical Advance Directive? Yes No Yes No No  Type of Advance Directive Living will;Healthcare Power of English as a second language teacher of [employer];Living will - -  Does patient want to make changes to medical advance directive? No - Patient declined - - - -  Copy of Healthcare Power of Attorney in Chart? No - copy requested - No - copy requested - -  Would patient like information on creating a medical advance directive? - No - Patient declined - No - patient declined information No - patient declined information    Hospital Utilization Over the Past 12 Months: # of hospitalizations or ER visits: 0 # of surgeries: 0  Review of Systems    Patient reports that her overall health is unchanged when compared to last year.  Review of Systems: History obtained from chart review and the patient  All other systems negative.  Pain Assessment Pain : No/denies pain     Current Medications & Allergies (verified) Allergies as of 10/23/2020   No Known Allergies      Medication List        Accurate as of October 23, 2020  1:45 PM. If you have any questions, ask your nurse or doctor.          atorvastatin 40 MG tablet Commonly known as: LIPITOR TAKE ONE TABLET BY MOUTH DAILY AT 6 P.M.   cholecalciferol 1000 units tablet Commonly known as: VITAMIN D Take 1,000 Units by mouth daily.    diclofenac 75 MG EC tablet Commonly known as: VOLTAREN Take 1 tablet (75 mg total) by mouth Alexis (two) times daily. What changed: additional instructions        History (reviewed): History reviewed. No pertinent past medical history. Past Surgical History:  Procedure Laterality Date   wrsit surgery, plate Left 10/4707   Dr. Dewaine Oats   Family History  Problem Relation Age of Onset   Kidney failure Father    Social History   Socioeconomic History   Marital status: Married    Fisher name: Alexis Fisher   Number of Fisher: Alexis   Years of education: 16   Highest education level: Bachelor's degree (e.g., BA, AB, BS)  Occupational History   Occupation: retired    Comment: Works part-time as a Lawyer.  Tobacco Use   Smoking status: Former    Types: Cigarettes    Quit date: 02/28/1998    Years since quitting: 22.6   Smokeless tobacco: Never  Vaping Use   Vaping Use: Never used  Substance and Sexual Activity   Alcohol use: Not Currently    Alcohol/week: 3.0 - 4.0 standard drinks    Types: 3 - 4 Cans of beer per week    Comment: wine and beer   Drug use: No   Sexual activity: Not Currently    Partners: Male  Other Topics Concern   Not on file  Social History Narrative   Lives with her husband.  Alexis Fisher enjoys playing golf, pickle ball, walking and reading.   Social Determinants of Health   Financial Resource Strain: Low Risk    Difficulty of Paying Living Expenses: Not hard at all  Food Insecurity: No Food Insecurity   Worried About Programme researcher, broadcasting/film/video in the Last Year: Never true   Ran Out of Food in the Last Year: Never true  Transportation Needs: No Transportation Needs   Lack of Transportation (Medical): No   Lack of Transportation (Non-Medical): No  Physical Activity: Sufficiently Active   Days of Exercise per Week: 3 days   Minutes of Exercise per Session: 120 min  Stress: No Stress Concern Present   Feeling of Stress : Not at all  Social Connections:  Moderately Integrated   Frequency of Communication with Friends and Family: Three times a week   Frequency of Social Gatherings with Friends and Family: Once a week   Attends Religious Services: Never   Database administrator or Organizations: Yes   Attends Engineer, structural: More than 4 times per year   Marital Status: Married    Activities of Daily Living In your present state of health, do you have any difficulty performing the following activities: 10/23/2020  Hearing? Y  Comment has noticed hearing loss in both ears.  Vision? Y  Comment her eyes have been bothering her and Alexis Fisher has an eye appointment on 10/27/20.  Difficulty concentrating or making decisions? N  Walking or climbing stairs? N  Dressing or bathing? N  Doing errands, shopping? N  Preparing Food and eating ? N  Using the Toilet? N  In the past six months, have you accidently leaked urine? N  Do you have problems with loss of bowel control? N  Managing your Medications? N  Managing your Finances? N  Housekeeping or managing your Housekeeping? N  Some recent data might be hidden    Patient Education/Literacy How often do you need to have someone help you when you read instructions, pamphlets, or other written materials from your doctor or pharmacy?: 1 - Never What is the last grade level you completed in school?: Bachelor's Degree  Exercise Current Exercise Habits: Home exercise routine, Type of exercise: Other - see comments;walking (golf and pickle ball.), Time (Minutes): > 60, Frequency (Times/Week): 3, Weekly Exercise (Minutes/Week): 0, Intensity: Moderate, Exercise limited by: None identified  Diet Patient reports consuming 3 meals a day and 1 snack(s) a day Patient reports that her primary diet is: Regular Patient reports that Alexis Fisher does have regular access to food.   Depression Screen PHQ Alexis/9 Scores 10/23/2020 11/14/2018 08/24/2017 08/06/2015 06/01/2015  PHQ - Alexis Score 0 0 0 0 0     Fall Risk Fall  Risk  10/23/2020 11/14/2018 08/24/2017 08/06/2015 06/01/2015  Falls in the past year? 0 0 No No No  Number falls in past yr: 0 0 - - -  Injury with Fall? 0 0 - - -  Risk for fall due to : No Fall Risks - - - -  Follow up Falls evaluation completed - - - -     Objective:   BP 127/71 (BP Location: Right Arm, Patient Position: Sitting, Cuff Size: Normal)   Pulse 74   Ht 5' 7.5" (1.715 m)   Wt 201 lb (91.Alexis kg)   SpO2 100%   BMI 31.02 kg/m   Last Weight  Most recent update: 10/23/2020  1:09 PM    Weight  91.Alexis kg (201 lb)  Body mass index is 31.02 kg/m.  Hearing/Vision  Alexis Fisher did not have difficulty with hearing/understanding during the face-to-face interview Alexis Fisher did not have difficulty with her vision during the face-to-face interview Reports that Alexis Fisher has had a formal eye exam by an eye care professional within the past year Reports that Alexis Fisher has not had a formal hearing evaluation within the past year  Cognitive Function: 6CIT Screen 10/23/2020 08/24/2017 08/23/2016  What Year? 0 points 0 points 0 points  What month? 0 points 0 points 0 points  What time? 0 points 0 points 0 points  Count back from 20 0 points 0 points 0 points  Months in reverse 0 points 0 points 0 points  Repeat phrase 0 points Alexis points Alexis points  Total Score 0 Alexis Alexis    Normal Cognitive Function Screening: Yes (Normal:0-7, Significant for Dysfunction: >8)  Immunization & Health Maintenance Record Immunization History  Administered Date(s) Administered   Fluad Quad(high Dose 65+) 11/14/2018, 11/15/2019   Influenza, High Dose Seasonal PF 12/25/2013, 11/30/2017   Influenza,inj,Quad PF,6+ Mos 11/06/2014   Influenza,inj,quad, With Preservative 11/29/2016   PFIZER(Purple Top)SARS-COV-Alexis Vaccination 03/20/2019, 04/16/2019, 12/01/2019   Pneumococcal Conjugate-13 08/06/2015   Pneumococcal Polysaccharide-23 08/23/2016   Tdap 04/03/2007, 11/06/2014   Zoster, Live 10/11/2010    Health Maintenance  Topic  Date Due   COVID-19 Vaccine (4 - Booster for Pfizer series) 11/08/2020 (Originally Alexis/04/2020)   Zoster Vaccines- Shingrix (1 of Alexis) 01/23/2021 (Originally 12/21/1999)   INFLUENZA VACCINE  05/28/2021 (Originally 09/28/2020)   MAMMOGRAM  10/03/2022   COLONOSCOPY (Pts 45-68yrs Insurance coverage will need to be confirmed)  02/29/2024   TETANUS/TDAP  11/05/2024   DEXA SCAN  Completed   Hepatitis C Screening  Completed   PNA vac Low Risk Adult  Completed   HPV VACCINES  Aged Out       Assessment  This is a routine wellness examination for Alexis Fisher.  Health Maintenance: Due or Overdue There are no preventive care reminders to display for this patient.   Alexis Fisher does not need a referral for Community Assistance: Care Management:   no Social Work:    no Prescription Assistance:  no Nutrition/Diabetes Education:  no   Plan:  Personalized Goals  Goals Addressed               This Visit's Progress     Patient Stated (pt-stated)        Would like to work on loosing 10 lbs.       Personalized Health Maintenance & Screening Recommendations  Influenza vaccine - Patient wants to wait until September to take the vaccine. Shingles vaccine  Lung Cancer Screening Recommended: no (Low Dose CT Chest recommended if Age 99-80 years, 30 pack-year currently smoking OR have quit w/in past 15 years) Hepatitis C Screening recommended: no HIV Screening recommended: no  Advanced Directives: Written information was not given per the patient's request.  Referrals & Orders No orders of the defined types were placed in this encounter.   Follow-up Plan Follow-up with Alexis Games, Alexis Fisher as planned Flu shot can be done at your next in office visit. Medicare wellness visit in one year. AVS printed and given to the patient.    I have personally reviewed and noted the following in the patient's chart:   Medical and social history Use of alcohol, tobacco or illicit drugs   Current medications and supplements Functional ability and status Nutritional status Physical activity Advanced directives List of other physicians Hospitalizations,  surgeries, and ER visits in previous 12 months Vitals Screenings to include cognitive, depression, and falls Referrals and appointments  In addition, I have reviewed and discussed with patient certain preventive protocols, quality metrics, and best practice recommendations. A written personalized care plan for preventive services as well as general preventive health recommendations were provided to patient.     Modesto CharonBableen  Kunta Hilleary, RN  10/23/2020

## 2020-10-23 NOTE — Patient Instructions (Addendum)
MEDICARE ANNUAL WELLNESS VISIT Health Maintenance Summary and Written Plan of Care  Alexis Fisher ,  Thank you for allowing me to perform your Medicare Annual Wellness Visit and for your ongoing commitment to your health.   Health Maintenance & Immunization History Health Maintenance  Topic Date Due   COVID-19 Vaccine (4 - Booster for Pfizer series) 11/08/2020 (Originally 04/02/2020)   Zoster Vaccines- Shingrix (1 of 2) 01/23/2021 (Originally 12/21/1999)   INFLUENZA VACCINE  05/28/2021 (Originally 09/28/2020)   MAMMOGRAM  10/03/2022   COLONOSCOPY (Pts 45-54yrs Insurance coverage will need to be confirmed)  02/29/2024   TETANUS/TDAP  11/05/2024   DEXA SCAN  Completed   Hepatitis C Screening  Completed   PNA vac Low Risk Adult  Completed   HPV VACCINES  Aged Out   Immunization History  Administered Date(s) Administered   Fluad Quad(high Dose 65+) 11/14/2018, 11/15/2019   Influenza, High Dose Seasonal PF 12/25/2013, 11/30/2017   Influenza,inj,Quad PF,6+ Mos 11/06/2014   Influenza,inj,quad, With Preservative 11/29/2016   PFIZER(Purple Top)SARS-COV-2 Vaccination 03/20/2019, 04/16/2019, 12/01/2019   Pneumococcal Conjugate-13 08/06/2015   Pneumococcal Polysaccharide-23 08/23/2016   Tdap 04/03/2007, 11/06/2014   Zoster, Live 10/11/2010    These are the patient goals that we discussed:  Goals Addressed               This Visit's Progress     Patient Stated (pt-stated)        Would like to work on loosing 10 lbs.         This is a list of Health Maintenance Items that are overdue or due now: Influenza vaccine - Patient wants to wait until September to take the vaccine. Shingles vaccine    Orders/Referrals Placed Today: No orders of the defined types were placed in this encounter.  (Contact our referral department at [phone] if you have not spoken with someone about your referral appointment within the next 5 days)    Follow-up Plan Follow-up with Agapito Games, MD as planned Flu shot can be done at your next in office visit. Medicare wellness visit in one year. AVS printed and given to the patient.       Health Maintenance, Female Adopting a healthy lifestyle and getting preventive care are important in promoting health and wellness. Ask your health care provider about: The right schedule for you to have regular tests and exams. Things you can do on your own to prevent diseases and keep yourself healthy. What should I know about diet, weight, and exercise? Eat a healthy diet  Eat a diet that includes plenty of vegetables, fruits, low-fat dairy products, and lean protein. Do not eat a lot of foods that are high in solid fats, added sugars, or sodium.  Maintain a healthy weight Body mass index (BMI) is used to identify weight problems. It estimates body fat based on height and weight. Your health care provider can help determineyour BMI and help you achieve or maintain a healthy weight. Get regular exercise Get regular exercise. This is one of the most important things you can do for your health. Most adults should: Exercise for at least 150 minutes each week. The exercise should increase your heart rate and make you sweat (moderate-intensity exercise). Do strengthening exercises at least twice a week. This is in addition to the moderate-intensity exercise. Spend less time sitting. Even light physical activity can be beneficial. Watch cholesterol and blood lipids Have your blood tested for lipids and cholesterol at 72 years of age, then  havethis test every 5 years. Have your cholesterol levels checked more often if: Your lipid or cholesterol levels are high. You are older than 71 years of age. You are at high risk for heart disease. What should I know about cancer screening? Depending on your health history and family history, you may need to have cancer screening at various ages. This may include screening for: Breast  cancer. Cervical cancer. Colorectal cancer. Skin cancer. Lung cancer. What should I know about heart disease, diabetes, and high blood pressure? Blood pressure and heart disease High blood pressure causes heart disease and increases the risk of stroke. This is more likely to develop in people who have high blood pressure readings, are of African descent, or are overweight. Have your blood pressure checked: Every 3-5 years if you are 43-65 years of age. Every year if you are 15 years old or older. Diabetes Have regular diabetes screenings. This checks your fasting blood sugar level. Have the screening done: Once every three years after age 71 if you are at a normal weight and have a low risk for diabetes. More often and at a younger age if you are overweight or have a high risk for diabetes. What should I know about preventing infection? Hepatitis B If you have a higher risk for hepatitis B, you should be screened for this virus. Talk with your health care provider to find out if you are at risk forhepatitis B infection. Hepatitis C Testing is recommended for: Everyone born from [birth year] through 1966. Anyone with known risk factors for hepatitis C. Sexually transmitted infections (STIs) Get screened for STIs, including gonorrhea and chlamydia, if: You are sexually active and are younger than 72 years of age. You are older than 71 years of age and your health care provider tells you that you are at risk for this type of infection. Your sexual activity has changed since you were last screened, and you are at increased risk for chlamydia or gonorrhea. Ask your health care provider if you are at risk. Ask your health care provider about whether you are at high risk for HIV. Your health care provider may recommend a prescription medicine to help prevent HIV infection. If you choose to take medicine to prevent HIV, you should first get tested for HIV. You should then be tested every 3 months for as  long as you are taking the medicine. Pregnancy If you are about to stop having your period (premenopausal) and you may become pregnant, seek counseling before you get pregnant. Take 400 to 800 micrograms (mcg) of folic acid every day if you become pregnant. Ask for birth control (contraception) if you want to prevent pregnancy. Osteoporosis and menopause Osteoporosis is a disease in which the bones lose minerals and strength with aging. This can result in bone fractures. If you are 100 years old or older, or if you are at risk for osteoporosis and fractures, ask your health care provider if you should: Be screened for bone loss. Take a calcium or vitamin D supplement to lower your risk of fractures. Be given hormone replacement therapy (HRT) to treat symptoms of menopause. Follow these instructions at home: Lifestyle Do not use any products that contain nicotine or tobacco, such as cigarettes, e-cigarettes, and chewing tobacco. If you need help quitting, ask your health care provider. Do not use street drugs. Do not share needles. Ask your health care provider for help if you need support or information about quitting drugs. Alcohol use Do not drink  alcohol if: Your health care provider tells you not to drink. You are pregnant, may be pregnant, or are planning to become pregnant. If you drink alcohol: Limit how much you use to 0-1 drink a day. Limit intake if you are breastfeeding. Be aware of how much alcohol is in your drink. In the [place] one drink equals one 12 oz bottle of beer (355 mL), one 5 oz glass of wine (148 mL), or one 1 oz glass of hard liquor (44 mL). General instructions Schedule regular health, dental, and eye exams. Stay current with your vaccines. Tell your health care provider if: You often feel depressed. You have ever been abused or do not feel safe at home. Summary Adopting a healthy lifestyle and getting preventive care are important in promoting health and  wellness. Follow your health care provider's instructions about healthy diet, exercising, and getting tested or screened for diseases. Follow your health care provider's instructions on monitoring your cholesterol and blood pressure. This information is not intended to replace advice given to you by your health care provider. Make sure you discuss any questions you have with your healthcare provider. Document Revised: 02/07/2018 Document Reviewed: 02/07/2018 Elsevier Patient Education  2022 [employer].

## 2020-11-16 ENCOUNTER — Ambulatory Visit (INDEPENDENT_AMBULATORY_CARE_PROVIDER_SITE_OTHER): Payer: Medicare PPO | Admitting: Family Medicine

## 2020-11-16 ENCOUNTER — Other Ambulatory Visit: Payer: Self-pay

## 2020-11-16 ENCOUNTER — Encounter: Payer: Self-pay | Admitting: Family Medicine

## 2020-11-16 VITALS — BP 131/67 | HR 93 | Ht 68.0 in | Wt 202.0 lb

## 2020-11-16 DIAGNOSIS — Z0000 Encounter for general adult medical examination without abnormal findings: Secondary | ICD-10-CM | POA: Diagnosis not present

## 2020-11-16 DIAGNOSIS — R21 Rash and other nonspecific skin eruption: Secondary | ICD-10-CM

## 2020-11-16 DIAGNOSIS — N1831 Chronic kidney disease, stage 3a: Secondary | ICD-10-CM

## 2020-11-16 DIAGNOSIS — Z23 Encounter for immunization: Secondary | ICD-10-CM | POA: Diagnosis not present

## 2020-11-16 NOTE — Progress Notes (Signed)
Subjective:     Alexis Fisher is a 70 y.o. female and is here for a comprehensive physical exam. The patient reports no problems.  He is doing well.  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  But then she will probably back off on her hours she has not felt like she is had as much time to be consistent with her exercise.  Social History   Socioeconomic History   Marital status: Married    Spouse name: Ramon Dredge   Number of children: 2   Years of education: 16   Highest education level: Bachelor's degree (e.g., BA, AB, BS)  Occupational History   Occupation: retired    Comment: Works part-time as a Lawyer.  Tobacco Use   Smoking status: Former    Types: Cigarettes    Quit date: 02/28/1998    Years since quitting: 22.7   Smokeless tobacco: Never  Vaping Use   Vaping Use: Never used  Substance and Sexual Activity   Alcohol use: Not Currently    Alcohol/week: 3.0 - 4.0 standard drinks    Types: 3 - 4 Cans of beer per week    Comment: wine and beer   Drug use: No   Sexual activity: Not Currently    Partners: Male  Other Topics Concern   Not on file  Social History Narrative   Lives with her husband. She enjoys playing golf, pickle ball, walking and reading.   Social Determinants of Health   Financial Resource Strain: Low Risk    Difficulty of Paying Living Expenses: Not hard at all  Food Insecurity: No Food Insecurity   Worried About Programme researcher, broadcasting/film/video in the Last Year: Never true   Ran Out of Food in the Last Year: Never true  Transportation Needs: No Transportation Needs   Lack of Transportation (Medical): No   Lack of Transportation (Non-Medical): No  Physical Activity: Sufficiently Active   Days of Exercise per Week: 3 days   Minutes of Exercise per Session: 120 min  Stress: No Stress Concern Present   Feeling of Stress : Not at all  Social Connections: Moderately Integrated    Frequency of Communication with Friends and Family: Three times a week   Frequency of Social Gatherings with Friends and Family: Once a week   Attends Religious Services: Never   Database administrator or Organizations: Yes   Attends Engineer, structural: More than 4 times per year   Marital Status: Married  Catering manager Violence: Not At Risk   Fear of Current or Ex-Partner: No   Emotionally Abused: No   Physically Abused: No   Sexually Abused: No   Health Maintenance  Topic Date Due   Zoster Vaccines- Shingrix (1 of 2) 01/23/2021 (Originally 12/21/1999)   COVID-19 Vaccine (4 - Booster for Pfizer series) 12/03/2022 (Originally 04/02/2020)   MAMMOGRAM  10/03/2022   COLONOSCOPY (Pts 45-48yrs Insurance coverage will need to be confirmed)  02/29/2024   TETANUS/TDAP  11/05/2024   INFLUENZA VACCINE  Completed   DEXA SCAN  Completed   Hepatitis C Screening  Completed   HPV VACCINES  Aged Out    The following portions of the patient's history were reviewed and updated as appropriate: allergies, current medications, past family history, past medical history, past social history, past surgical history, and problem list.  Review of Systems A comprehensive review of  systems was negative.   Objective:    BP 131/67   Pulse 93   Ht 5\' 8"  (1.727 m)   Wt 202 lb (91.6 kg)   SpO2 96%   BMI 30.71 kg/m  General appearance: alert, cooperative, and appears stated age Head: Normocephalic, without obvious abnormality, atraumatic Eyes:  conj clear, EOMI, PEERLA Ears: normal TM's and external ear canals both ears Nose: Nares normal. Septum midline. Mucosa normal. No drainage or sinus tenderness. Throat: lips, mucosa, and tongue normal; teeth and gums normal Neck: no adenopathy, no carotid bruit, no JVD, supple, symmetrical, trachea midline, and thyroid not enlarged, symmetric, no tenderness/mass/nodules Back: symmetric, no curvature. ROM normal. No CVA tenderness. Lungs: clear to  auscultation bilaterally Breasts: normal appearance, no masses or tenderness Heart: regular rate and rhythm, S1, S2 normal, no murmur, click, rub or gallop Abdomen: soft, non-tender; bowel sounds normal; no masses,  no organomegaly Pelvic: cervix normal in appearance, external genitalia normal, no adnexal masses or tenderness, no cervical motion tenderness, rectovaginal septum normal, uterus normal size, shape, and consistency, and vagina normal without discharge Extremities: extremities normal, atraumatic, no cyanosis or edema Pulses: 2+ and symmetric Skin: Skin color, texture, turgor normal. No rashes or lesions    Assessment:    Healthy female exam.      Plan:     See After Visit Summary for Counseling Recommendations  Keep up a regular exercise program and make sure you are eating a healthy diet Try to eat 4 servings of dairy a day, or if you are lactose intolerant take a calcium with vitamin D daily.  Your vaccines are up to date.   Orders Placed This Encounter  Procedures   Cult, Fungus, Skin,Hair,Nail w/KOH    Specimen obtained from lower R calf   Flu Vaccine QUAD High Dose(Fluad)   COMPLETE METABOLIC PANEL WITH GFR   Lipid panel   CBC with Differential

## 2020-11-17 DIAGNOSIS — X32XXXA Exposure to sunlight, initial encounter: Secondary | ICD-10-CM | POA: Diagnosis not present

## 2020-11-17 DIAGNOSIS — D692 Other nonthrombocytopenic purpura: Secondary | ICD-10-CM | POA: Diagnosis not present

## 2020-11-17 DIAGNOSIS — L565 Disseminated superficial actinic porokeratosis (DSAP): Secondary | ICD-10-CM | POA: Diagnosis not present

## 2020-11-17 DIAGNOSIS — L578 Other skin changes due to chronic exposure to nonionizing radiation: Secondary | ICD-10-CM | POA: Diagnosis not present

## 2020-11-17 DIAGNOSIS — Z08 Encounter for follow-up examination after completed treatment for malignant neoplasm: Secondary | ICD-10-CM | POA: Diagnosis not present

## 2020-11-17 DIAGNOSIS — D485 Neoplasm of uncertain behavior of skin: Secondary | ICD-10-CM | POA: Diagnosis not present

## 2020-11-17 DIAGNOSIS — D225 Melanocytic nevi of trunk: Secondary | ICD-10-CM | POA: Diagnosis not present

## 2020-11-17 DIAGNOSIS — Z85828 Personal history of other malignant neoplasm of skin: Secondary | ICD-10-CM | POA: Diagnosis not present

## 2020-11-17 DIAGNOSIS — D0471 Carcinoma in situ of skin of right lower limb, including hip: Secondary | ICD-10-CM | POA: Diagnosis not present

## 2020-11-17 DIAGNOSIS — L570 Actinic keratosis: Secondary | ICD-10-CM | POA: Diagnosis not present

## 2020-11-17 DIAGNOSIS — L821 Other seborrheic keratosis: Secondary | ICD-10-CM | POA: Diagnosis not present

## 2020-11-19 DIAGNOSIS — E7800 Pure hypercholesterolemia, unspecified: Secondary | ICD-10-CM | POA: Diagnosis not present

## 2020-11-19 DIAGNOSIS — N1831 Chronic kidney disease, stage 3a: Secondary | ICD-10-CM | POA: Diagnosis not present

## 2020-11-19 DIAGNOSIS — Z0000 Encounter for general adult medical examination without abnormal findings: Secondary | ICD-10-CM | POA: Diagnosis not present

## 2020-11-19 LAB — COMPLETE METABOLIC PANEL WITH GFR
AG Ratio: 1.8 (calc) (ref 1.0–2.5)
ALT: 14 U/L (ref 6–29)
AST: 15 U/L (ref 10–35)
Albumin: 4.2 g/dL (ref 3.6–5.1)
Alkaline phosphatase (APISO): 54 U/L (ref 37–153)
BUN/Creatinine Ratio: 17 (calc) (ref 6–22)
BUN: 18 mg/dL (ref 7–25)
CO2: 28 mmol/L (ref 20–32)
Calcium: 9 mg/dL (ref 8.6–10.4)
Chloride: 105 mmol/L (ref 98–110)
Creat: 1.04 mg/dL — ABNORMAL HIGH (ref 0.60–1.00)
Globulin: 2.3 g/dL (calc) (ref 1.9–3.7)
Glucose, Bld: 98 mg/dL (ref 65–99)
Potassium: 4.3 mmol/L (ref 3.5–5.3)
Sodium: 139 mmol/L (ref 135–146)
Total Bilirubin: 0.6 mg/dL (ref 0.2–1.2)
Total Protein: 6.5 g/dL (ref 6.1–8.1)
eGFR: 58 mL/min/{1.73_m2} — ABNORMAL LOW (ref >=60)

## 2020-11-19 LAB — LIPID PANEL
Cholesterol: 185 mg/dL (ref <200)
HDL: 53 mg/dL (ref >=50)
LDL Cholesterol (Calc): 111 mg/dL (calc) — ABNORMAL HIGH
Non-HDL Cholesterol (Calc): 132 mg/dL (calc) — ABNORMAL HIGH (ref <130)
Total CHOL/HDL Ratio: 3.5 (calc) (ref <5.0)
Triglycerides: 106 mg/dL (ref <150)

## 2020-11-19 LAB — CBC WITH DIFFERENTIAL/PLATELET
Absolute Monocytes: 394 cells/uL (ref 200–950)
Basophils Absolute: 19 cells/uL (ref 0–200)
Basophils Relative: 0.7 %
Eosinophils Absolute: 100 cells/uL (ref 15–500)
Eosinophils Relative: 3.7 %
HCT: 40.6 % (ref 35.0–45.0)
Hemoglobin: 12.8 g/dL (ref 11.7–15.5)
Lymphs Abs: 694 cells/uL — ABNORMAL LOW (ref 850–3900)
MCH: 28.2 pg (ref 27.0–33.0)
MCHC: 31.5 g/dL — ABNORMAL LOW (ref 32.0–36.0)
MCV: 89.4 fL (ref 80.0–100.0)
MPV: 9.3 fL (ref 7.5–12.5)
Monocytes Relative: 14.6 %
Neutro Abs: 1493 cells/uL — ABNORMAL LOW (ref 1500–7800)
Neutrophils Relative %: 55.3 %
Platelets: 171 10*3/uL (ref 140–400)
RBC: 4.54 10*6/uL (ref 3.80–5.10)
RDW: 12.5 % (ref 11.0–15.0)
Total Lymphocyte: 25.7 %
WBC: 2.7 10*3/uL — ABNORMAL LOW (ref 3.8–10.8)

## 2020-11-23 NOTE — Progress Notes (Signed)
Hi Alexis Fisher, your kidney function is stable.  DL cholesterol went up a little bit compared to last year its up about 20 points just continue work on healthy diet and regular exercise to bring that back down.  White blood cell count is still on the low end but stable over the last couple of years.  Skin scraping is negative so far but they are still watching the culture.

## 2020-12-16 LAB — CULT, FUNGUS, SKIN,HAIR,NAIL W/KOH
CULTURE:: NO GROWTH
MICRO NUMBER:: 12394125
SMEAR:: NONE SEEN
SPECIMEN QUALITY:: ADEQUATE

## 2020-12-21 DIAGNOSIS — D0471 Carcinoma in situ of skin of right lower limb, including hip: Secondary | ICD-10-CM | POA: Diagnosis not present

## 2020-12-21 DIAGNOSIS — X32XXXA Exposure to sunlight, initial encounter: Secondary | ICD-10-CM | POA: Diagnosis not present

## 2020-12-21 DIAGNOSIS — L565 Disseminated superficial actinic porokeratosis (DSAP): Secondary | ICD-10-CM | POA: Diagnosis not present

## 2020-12-21 DIAGNOSIS — L570 Actinic keratosis: Secondary | ICD-10-CM | POA: Diagnosis not present

## 2021-03-22 IMAGING — DX DG LUMBAR SPINE COMPLETE 4+V
5 series · 5 of 5 positions shown · non-contrast
Comparison: None.

CLINICAL DATA: Low back pain and right sacroiliac joint pain for
several months, no known injury, initial encounter

EXAM:
LUMBAR SPINE - COMPLETE 4+ VIEW

[l-spine ap]
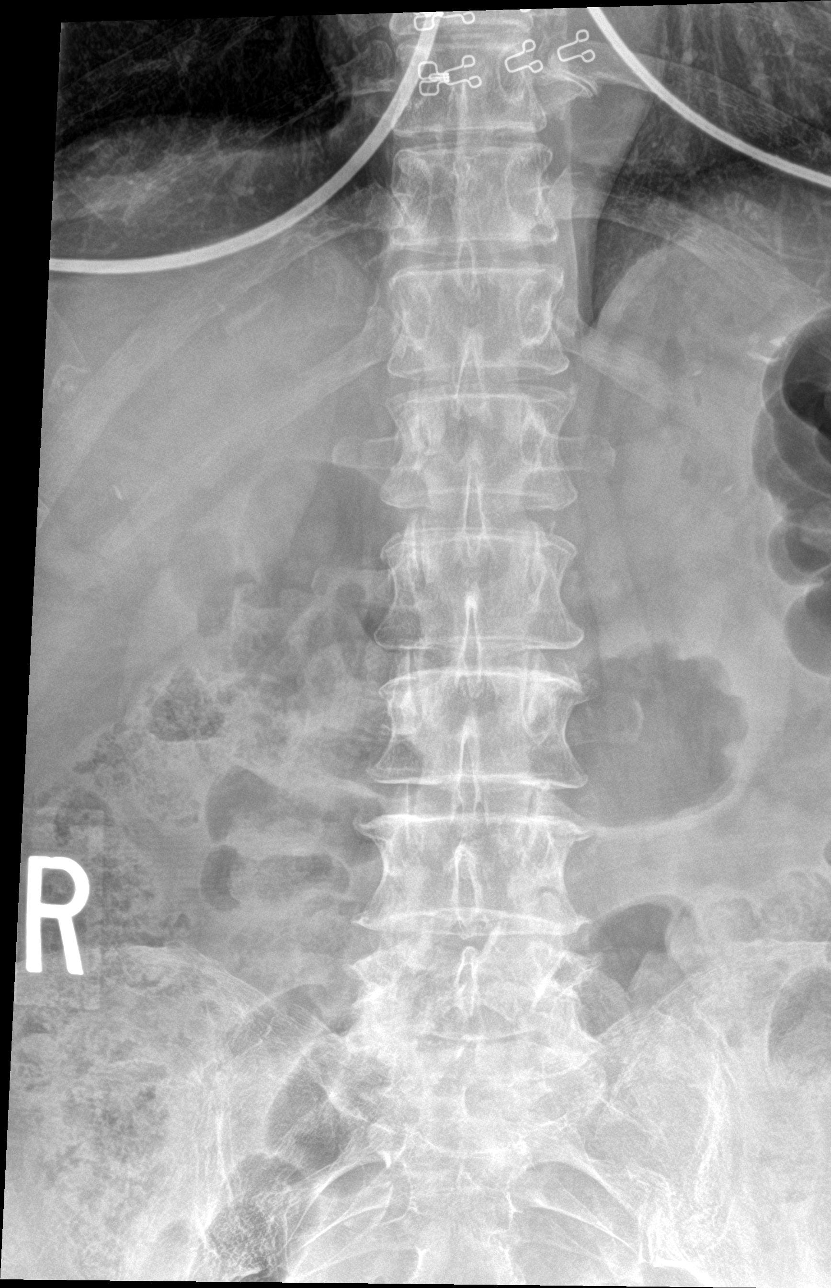

[l-spine obl (1 of 2)]
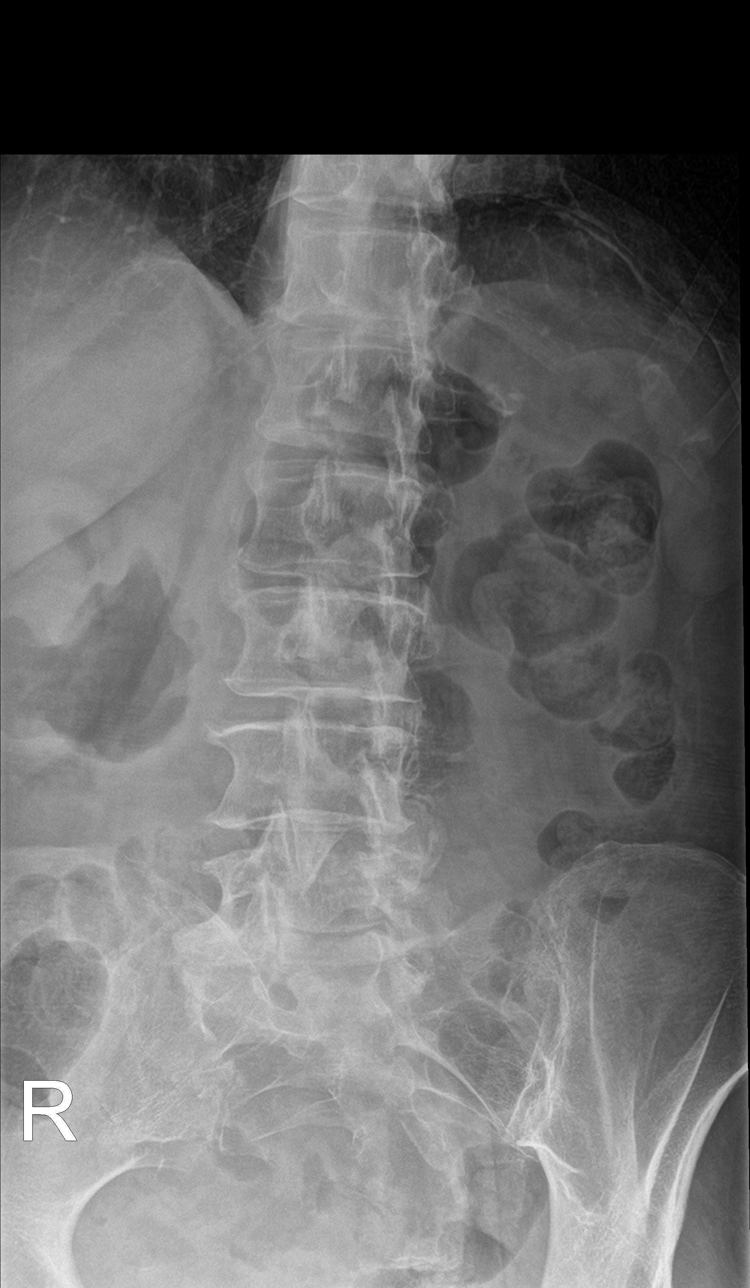

[l-spine obl (2 of 2)]
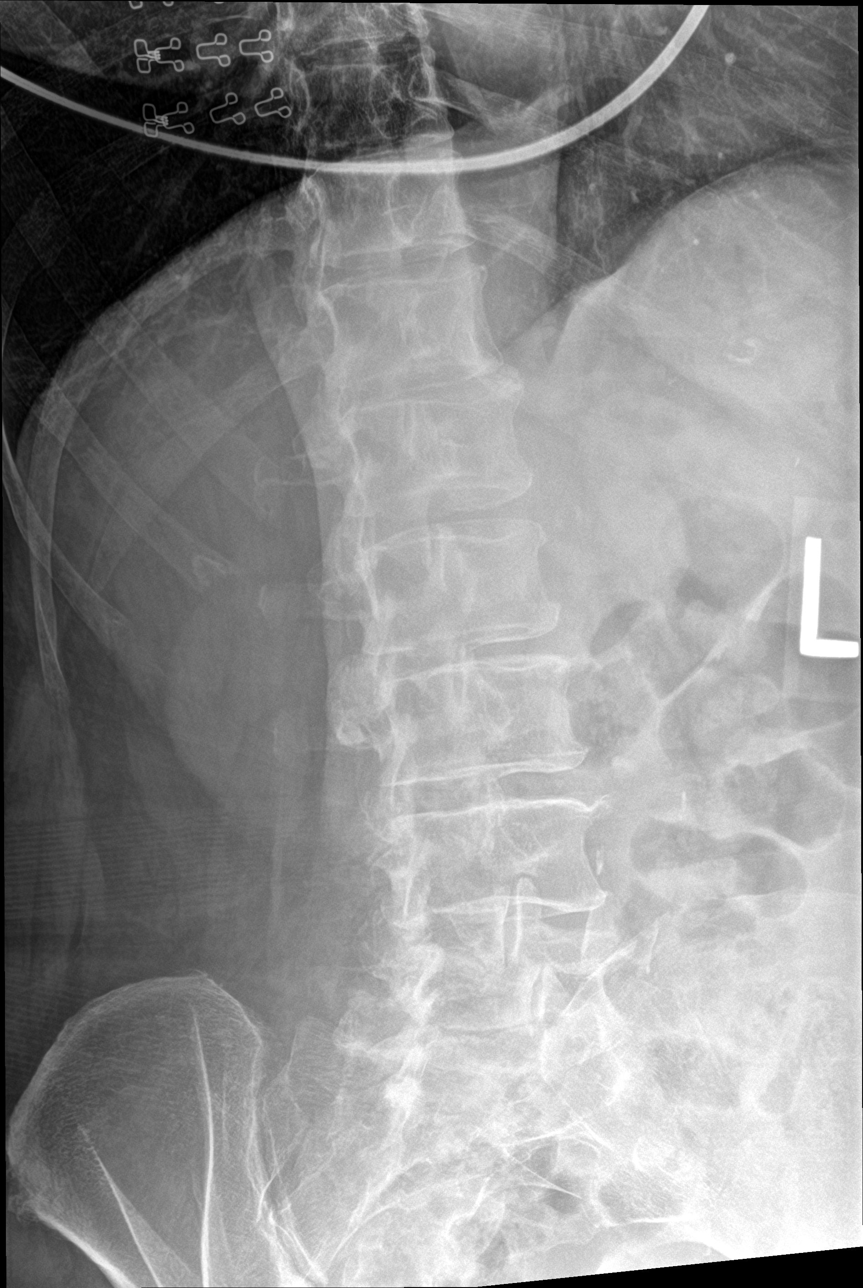

[l-spine lat]
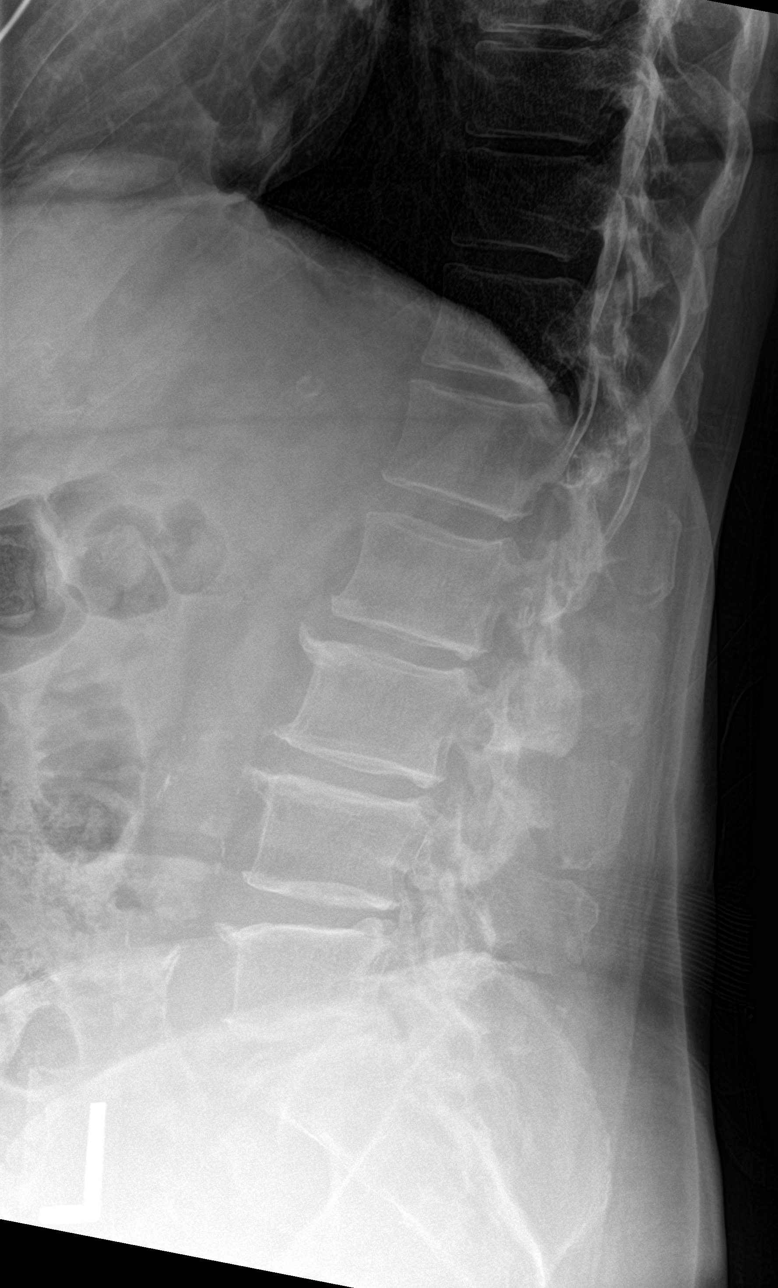

[l-spine spot]
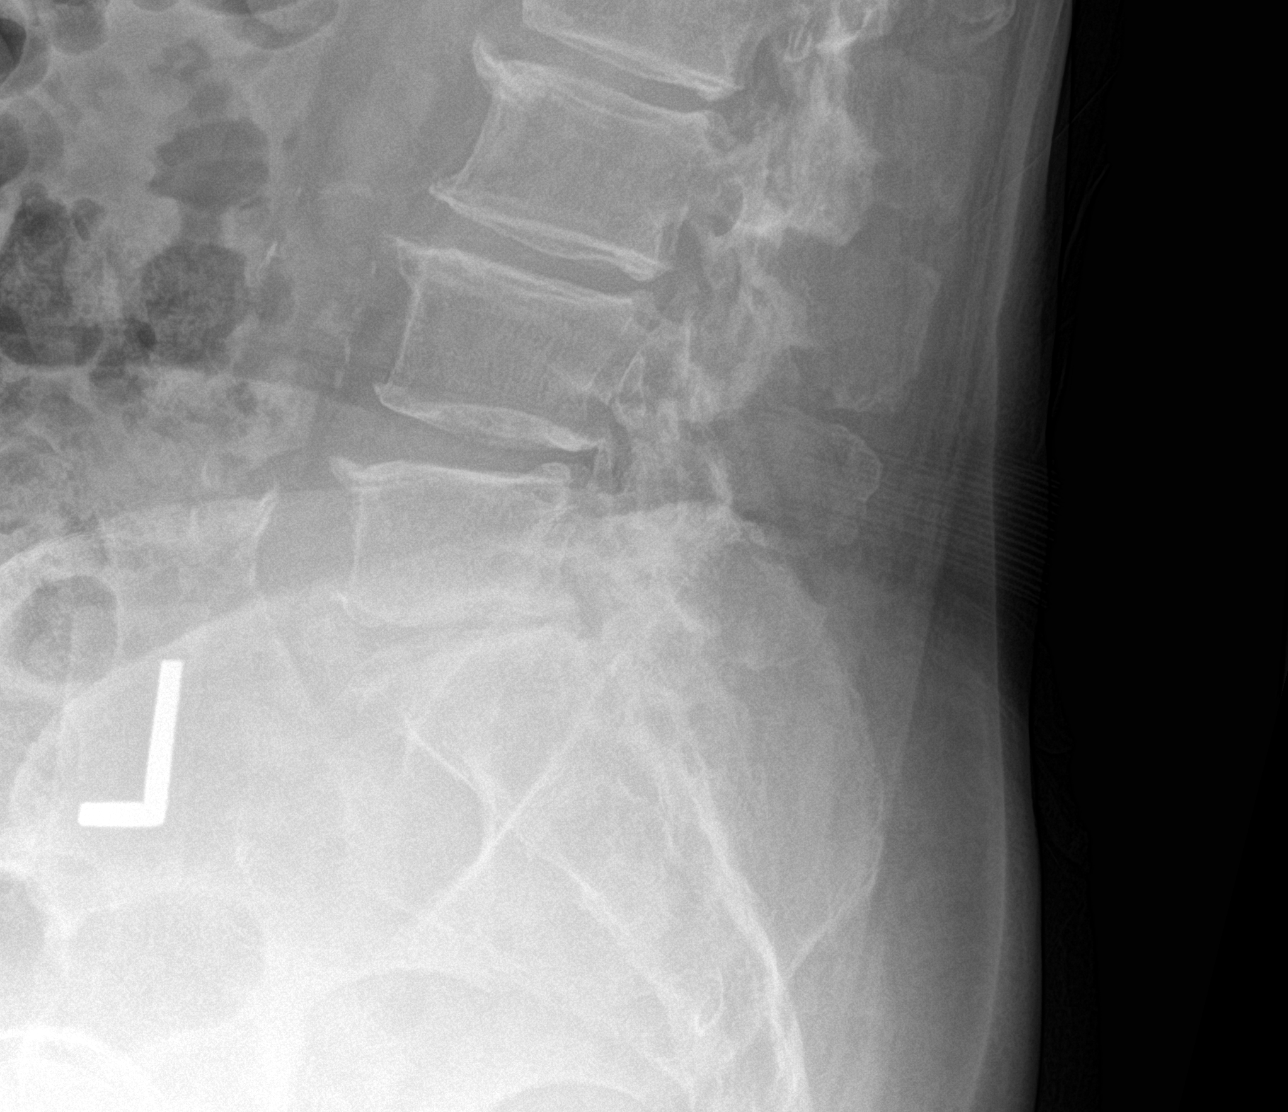

[5 of 5 positions shown; findings below may reference images not displayed]

FINDINGS: Five lumbar type vertebral bodies are well visualized. Multilevel
osteophytic changes are seen. No pars defects are noted. Mild disc
space narrowing is noted from L2-S1. No anterolisthesis is seen.
Atherosclerotic calcifications of the aorta are seen. A tiny
calcification is noted over the left renal outline which may
represent a nonobstructing stone.
IMPRESSION: Mild degenerative change without acute abnormality.

## 2021-03-29 ENCOUNTER — Other Ambulatory Visit: Payer: Self-pay | Admitting: Family Medicine

## 2021-03-29 ENCOUNTER — Other Ambulatory Visit: Payer: Self-pay

## 2021-03-29 DIAGNOSIS — E7800 Pure hypercholesterolemia, unspecified: Secondary | ICD-10-CM

## 2021-03-29 MED ORDER — ATORVASTATIN CALCIUM 40 MG PO TABS: 40.0000 mg | ORAL_TABLET | Freq: Every day | ORAL | 2 refills | Status: DC

## 2021-06-25 ENCOUNTER — Ambulatory Visit: Payer: Medicare PPO | Admitting: Family Medicine

## 2021-06-25 ENCOUNTER — Encounter: Payer: Self-pay | Admitting: Family Medicine

## 2021-06-25 VITALS — BP 131/70 | HR 85 | Resp 18 | Ht 68.0 in | Wt 199.0 lb

## 2021-06-25 DIAGNOSIS — L299 Pruritus, unspecified: Secondary | ICD-10-CM | POA: Diagnosis not present

## 2021-06-25 MED ORDER — PREDNISONE 20 MG PO TABS: 40.0000 mg | ORAL_TABLET | Freq: Every day | ORAL | 0 refills | Status: DC

## 2021-06-25 MED ORDER — CLOBETASOL PROPIONATE 0.05 % EX OINT: 1.0000 "application " | TOPICAL_OINTMENT | Freq: Two times a day (BID) | CUTANEOUS | 0 refills | Status: DC

## 2021-06-25 NOTE — Progress Notes (Signed)
? ?Acute Office Visit ? ?Subjective:  ? ?  ?Patient ID: Alexis Fisher, female    DOB: [birth year], 71 y.o.   MRN: [id number] ? ?Chief Complaint  ?Patient presents with  ? Rash  ?  On back and forehead, itching and prickly feeling, 6 days  ? ? ?HPI ?Patient is in today for itching.  She said she went to a wedding on Saturday it was outdoors but she was not really around a lot of brush they did park in a few old.  She said when she got home later that night she noticed a couple of bug bites on her lower legs and they were little itchy so she was just using some over-the-counter hydrocortisone.  But the next day she felt like she was having a lot of itching on her arms and particularly her forearms.  She says she took Arch[employer] and says the next day she felt a little better but now the itching is started to come back over her low back upper shoulders arms and her forehead.  She also noticed a new bug bite that came out behind her left knee.  She does not remember seeing a tick.  She does not have a fever or any of her other upper respiratory symptoms.  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  She has noticed some watery eyes but no itching or sneezing. ? ?She also asked me about the rash that she has had on her right lower leg at the top of her calf.  The skin scraping came back negative.  She says the rash is still there and never went away.  Most the time its actually not bothersome. ? ?ROS ? ? ?   ?Objective:  ?  ?BP 131/70   Pulse 85   Resp 18   Ht 5\' 8"  (1.727 m)   Wt 199 lb (90.3 kg)   SpO2 97%   BMI 30.26 kg/m?  ? ? ?Physical Exam ?Vitals reviewed.  ?Constitutional:   ?   Appearance: She is well-developed.  ?HENT:  ?   Head: Normocephalic and atraumatic.  ?Eyes:  ?   Conjunctiva/sclera: Conjunctivae normal.  ?Cardiovascular:  ?   Rate and Rhythm: Normal rate.  ?Pulmonary:  ?   Effort: Pulmonary effort is normal.  ?Musculoskeletal:  ?   Comments: She has a very mildly erythematous maculopapular rash on her forehead.   Mostly macular.  I do not see a rash anywhere else on her arms or her back or the rest of her face.  ?Skin: ?   General: Skin is dry.  ?   Coloration: Skin is not pale.  ?Neurological:  ?   Mental Status: She is alert and oriented to person, place, and time.  ?Psychiatric:     ?   Behavior: Behavior normal.  ? ? ?No results found for any visits on 06/25/21. ? ? ?   ?Assessment & Plan:  ? ?Problem List Items Addressed This Visit   ?None ?Visit Diagnoses   ? ? Itching    -  Primary  ? Relevant Orders  ? CBC with Differential/Platelet  ? COMPLETE METABOLIC PANEL WITH GFR  ? ?  ? ? ?Itching-unclear etiology the only place to see a little bit of a rash is actually on her forehead I do not see it anywhere else.  So we discussed a couple options including checking a CBC with differential to see if the eosinophils are elevated and checking liver enzymes.  Certainly it could be an  allergic reaction to something but she denies any changes in soaps, perfumes, detergents etc.  If not improving over the next week then please let us know.  We also discussed a trial of prednisone over the weekend to see if that is helpful as well. ? ?For the rash on her right lower leg that is been there for months I did send over prescription for clobetasol ointment.  Use for 2 weeks and if the symptoms are not resolving then please let us know. ? ?Meds ordered this encounter  ?Medications  ? clobetasol ointment (TEMOVATE) 0.05 %  ?  Sig: Apply 1 application. topically 2 (two) times daily. X 2 weeks and then give the skin a break.  ?  Dispense:  30 g  ?  Refill:  0  ? predniSONE (DELTASONE) 20 MG tablet  ?  Sig: Take 2 tablets (40 mg total) by mouth daily with breakfast.  ?  Dispense:  10 tablet  ?  Refill:  0  ? ? ?No follow-ups on file. ? ?Nani Gasser, MD ? ? ?

## 2021-06-26 LAB — COMPLETE METABOLIC PANEL WITH GFR
AG Ratio: 1.8 (calc) (ref 1.0–2.5)
ALT: 14 U/L (ref 6–29)
AST: 15 U/L (ref 10–35)
Albumin: 4.3 g/dL (ref 3.6–5.1)
Alkaline phosphatase (APISO): 57 U/L (ref 37–153)
BUN/Creatinine Ratio: 16 (calc) (ref 6–22)
BUN: 18 mg/dL (ref 7–25)
CO2: 27 mmol/L (ref 20–32)
Calcium: 9.1 mg/dL (ref 8.6–10.4)
Chloride: 106 mmol/L (ref 98–110)
Creat: 1.1 mg/dL — ABNORMAL HIGH (ref 0.60–1.00)
Globulin: 2.4 g/dL (calc) (ref 1.9–3.7)
Glucose, Bld: 109 mg/dL — ABNORMAL HIGH (ref 65–99)
Potassium: 4.3 mmol/L (ref 3.5–5.3)
Sodium: 141 mmol/L (ref 135–146)
Total Bilirubin: 0.5 mg/dL (ref 0.2–1.2)
Total Protein: 6.7 g/dL (ref 6.1–8.1)
eGFR: 54 mL/min/{1.73_m2} — ABNORMAL LOW (ref >=60)

## 2021-06-26 LAB — CBC WITH DIFFERENTIAL/PLATELET
Absolute Monocytes: 350 cells/uL (ref 200–950)
Basophils Absolute: 19 cells/uL (ref 0–200)
Basophils Relative: 0.5 %
Eosinophils Absolute: 171 cells/uL (ref 15–500)
Eosinophils Relative: 4.5 %
HCT: 39.2 % (ref 35.0–45.0)
Hemoglobin: 13 g/dL (ref 11.7–15.5)
Lymphs Abs: 1026 cells/uL (ref 850–3900)
MCH: 28.7 pg (ref 27.0–33.0)
MCHC: 33.2 g/dL (ref 32.0–36.0)
MCV: 86.5 fL (ref 80.0–100.0)
MPV: 9.7 fL (ref 7.5–12.5)
Monocytes Relative: 9.2 %
Neutro Abs: 2234 cells/uL (ref 1500–7800)
Neutrophils Relative %: 58.8 %
Platelets: 191 10*3/uL (ref 140–400)
RBC: 4.53 10*6/uL (ref 3.80–5.10)
RDW: 12.7 % (ref 11.0–15.0)
Total Lymphocyte: 27 %
WBC: 3.8 10*3/uL (ref 3.8–10.8)

## 2021-06-29 NOTE — Progress Notes (Signed)
Your lab work is within acceptable range and there are no concerning findings.   ?

## 2021-08-16 ENCOUNTER — Ambulatory Visit: Payer: Medicare PPO | Admitting: Sports Medicine

## 2021-08-16 ENCOUNTER — Ambulatory Visit (INDEPENDENT_AMBULATORY_CARE_PROVIDER_SITE_OTHER): Payer: Medicare PPO

## 2021-08-16 DIAGNOSIS — M7061 Trochanteric bursitis, right hip: Secondary | ICD-10-CM | POA: Insufficient documentation

## 2021-08-16 DIAGNOSIS — M47816 Spondylosis without myelopathy or radiculopathy, lumbar region: Secondary | ICD-10-CM | POA: Diagnosis not present

## 2021-08-16 DIAGNOSIS — M1611 Unilateral primary osteoarthritis, right hip: Secondary | ICD-10-CM | POA: Diagnosis not present

## 2021-08-16 NOTE — Assessment & Plan Note (Signed)
Pleasant 71 year old female, playing pickle ball last week, had some pain at right hip laterally. On exam today she has tenderness over the greater trochanter with weakness to hip abduction. Good motion to internal rotation, flexion, extension. Suspect classic trochanteric bursitis, we discussed the pathophysiology including the importance of strengthening her hip abductors. We will get x-rays, she will do some formal physical therapy and she can use some leftover meloxicam. Return to see me in 4 to 6 weeks, injection if not better.

## 2021-08-16 NOTE — Progress Notes (Signed)
    Procedures performed today:    None.  Independent interpretation of notes and tests performed by another provider:   None.  Brief History, Exam, Impression, and Recommendations:    Trochanteric bursitis, right hip Pleasant 71 year old female, playing pickle ball last week, had some pain at right hip laterally. On exam today she has tenderness over the greater trochanter with weakness to hip abduction. Good motion to internal rotation, flexion, extension. Suspect classic trochanteric bursitis, we discussed the pathophysiology including the importance of strengthening her hip abductors. We will get x-rays, she will do some formal physical therapy and she can use some leftover meloxicam. Return to see me in 4 to 6 weeks, injection if not better.    ___________________________________________ [name]. Benjamin Stain, M.D., ABFM., CAQSM. Primary Care and Sports Medicine [facility]  Adjunct Instructor of Family Medicine  [employer]

## 2021-08-16 NOTE — Patient Instructions (Signed)
Okay to use the meloxicam daily for 2 weeks and then as needed, be sure to take it with food. Hold off on cyclobenzaprine/Flexeril, this is a muscle relaxer.

## 2021-08-17 ENCOUNTER — Telehealth: Payer: Self-pay

## 2021-08-17 NOTE — Telephone Encounter (Signed)
Left msg that her msg was received and would attempt to call again tomorrow.

## 2021-08-17 NOTE — Telephone Encounter (Signed)
You suggested she take meloxicam as we though that was the medication she had at home. After going home, she checked and what she has is diclofenac. Can she take the diclofenac instead of the meloxicam?

## 2021-08-17 NOTE — Telephone Encounter (Signed)
Absolutely, 75 mg twice a day.

## 2021-08-18 ENCOUNTER — Ambulatory Visit: Payer: Medicare PPO | Attending: Sports Medicine | Admitting: Rehabilitative and Restorative Service Providers"

## 2021-08-18 ENCOUNTER — Encounter: Payer: Self-pay | Admitting: Rehabilitative and Restorative Service Providers"

## 2021-08-18 DIAGNOSIS — R29898 Other symptoms and signs involving the musculoskeletal system: Secondary | ICD-10-CM | POA: Diagnosis not present

## 2021-08-18 DIAGNOSIS — M7061 Trochanteric bursitis, right hip: Secondary | ICD-10-CM | POA: Diagnosis not present

## 2021-08-18 DIAGNOSIS — R2689 Other abnormalities of gait and mobility: Secondary | ICD-10-CM | POA: Insufficient documentation

## 2021-08-18 NOTE — Telephone Encounter (Signed)
2nd attempt: left VM for a return call from patient.

## 2021-08-18 NOTE — Patient Instructions (Signed)
Access Code: [email or web address] URL: [email or web address] Date: 08/18/2021 Prepared by: Corlis Leak  Exercises - Supine Piriformis Stretch with Leg Straight  - 2 x daily - 7 x weekly - 1 sets - 3 reps - 30 sec  hold - Supine ITB Stretch with Strap  - 2 x daily - 7 x weekly - 1 sets - 3 reps - 30 sec  hold - Hooklying Gluteal Sets  - 2 x daily - 7 x weekly - 1 sets - 3 reps - 30 sec  hold - Standing Hip Abduction with Counter Support  - 2 x daily - 7 x weekly - 2-3 sets - 10 reps - 2-3 sec  hold  Patient Education - Trigger Point Dry Needling

## 2021-08-18 NOTE — Therapy (Signed)
[facility] [place] [address] [place] [place] [address] [place], [place], [address] Phone: [phone]   Fax:  [phone]  Physical Therapy Evaluation  Rationale for Evaluation and Treatment Rehabilitation   Patient Details  Name: Alexis Fisher MRN: [id number] Date of Birth: 14-Dec-1950 Referring Provider (PT): Dr Benjamin Stain   Encounter Date: 08/18/2021   PT End of Session - 08/18/21 1251     Visit Number 1    Number of Visits 12    Date for PT Re-Evaluation 09/29/21    PT Start Time 1015    PT Stop Time 1106    PT Time Calculation (min) 51 min    Activity Tolerance Patient tolerated treatment well             History reviewed. No pertinent past medical history.  Past Surgical History:  Procedure Laterality Date   wrsit surgery, plate Left 02/4968   Dr. Dewaine Oats    There were no vitals filed for this visit.    Subjective Assessment - 08/18/21 1015     Subjective Patient rports that she noticed pain in the Rt hip when playing pickle ball ~ 3 weeks ago with symptoms progressively worsening. She can't climb stairs and walks with a limp. She feels "weak" in the Rt side.    Patient Stated Goals get rid of the pain and walk normally and return to golf and pickle ball    Currently in Pain? Yes    Pain Score 3     Pain Location Hip    Pain Orientation Right    Pain Descriptors / Indicators Aching;Nagging    Pain Type Acute pain    Pain Onset 1 to 4 weeks ago    Pain Frequency Constant    Aggravating Factors  standing and returning to stand from sitting; sitting; climbing stairs; walking; feels leg is going to buckle                [facility] PT Assessment - 08/18/21 0001       Assessment   Medical Diagnosis Rt trochanteric bursitis    Referring Provider (PT) Dr Benjamin Stain    Onset Date/Surgical Date 07/29/21    Hand Dominance Right    Next MD Visit PRN    Prior Therapy none      Precautions   Precautions None       Restrictions   Weight Bearing Restrictions No      Balance Screen   Has the patient fallen in the past 6 months No    Has the patient had a decrease in activity level because of a fear of falling?  No    Is the patient reluctant to leave their home because of a fear of falling?  No      Home Tourist information centre manager residence    Living Arrangements Spouse/significant other      Prior Function   Level of Independence Independent    Vocation Retired    Buyer, retail retired 8 yrs ago; active with household chores, subs at school, golfing, pickle ball; walking      Observation/Other Assessments   Focus on Therapeutic Outcomes (FOTO)  42      Sensation   Additional Comments WFL's      Posture/Postural Control   Posture Comments flexed forward at hips; wt shifted to the Lt in standing      AROM   Overall AROM Comments WFL's lumbar and bilat LE's      Strength  Overall Strength Comments functional strength bilat LE's not tested resistively      Flexibility   Hamstrings tight Rt    Quadriceps tight Rt    ITB tight Rt    Piriformis tight Rt      Palpation   Palpation comment tightness Rt posterior hip through piriformis, glut med      Ambulation/Gait   Gait Comments limp Rt LE in wt bearing Rt LE                        Objective measurements completed on examination: See above findings.       [facility] Adult PT Treatment/Exercise - 08/18/21 0001       Knee/Hip Exercises: Stretches   ITB Stretch Right;2 reps;30 seconds   supine with strap   Piriformis Stretch Right;2 reps;30 seconds    Piriformis Stretch Limitations supine travell with strap as needed varying angles      Knee/Hip Exercises: Standing   Hip Abduction AROM;Stengthening;Right;Left;10 reps;Knee straight      Knee/Hip Exercises: Supine   Other Supine Knee/Hip Exercises glut set 10 sec x 10 reps      Moist Heat Therapy   Number Minutes Moist Heat 10 Minutes     Moist Heat Location Lumbar Spine;Hip      Manual Therapy   Manual therapy comments skilled palpation to assess response to DN and manual therapy    Joint Mobilization PA mobs Rt hip pt prone    Soft tissue mobilization deep tissue work Rt posterior hip    Myofascial Release Rt posterior hip    Passive ROM IR/ER Rt hip pt prone hip extended and knee flexed              Trigger Point Dry Needling - 08/18/21 0001     Consent Given? Yes    Education Handout Provided Yes    Electrical Stimulation Performed with Dry Needling Yes    E-stim with Dry Needling Details mAmp x 5 min    Other Dry Needling Rt    Gluteus Maximus Response Palpable increased muscle length;Twitch response elicited    Piriformis Response Palpable increased muscle length;Twitch response elicited                   PT Education - 08/18/21 1103     Education Details HEP POC DN    Person(s) Educated Patient    Methods Explanation;Demonstration;Tactile cues;Verbal cues;Handout    Comprehension Verbalized understanding;Returned demonstration;Verbal cues required;Tactile cues required                 PT Long Term Goals - 08/18/21 1255       PT LONG TERM GOAL #1   Title Decrease Rt hip/LE pain and limitations by 75-100% allowing patient to return to all normal functional activities    Time 6    Period Weeks    Status New    Target Date 09/29/21      PT LONG TERM GOAL #2   Title Improve hip mobility through the piriformis/gluts with patient to demonstrate good range with no pulling or pain with stretching    Time 6    Period Weeks    Status New    Target Date 09/29/21      PT LONG TERM GOAL #3   Title Normal transfers and gait pattern with no pain and no limp Rt LE    Time 6    Period Weeks    Status  New    Target Date 09/29/21      PT LONG TERM GOAL #4   Title Independent in HEP (including aquatic program as indicated)    Time 6    Period Weeks    Status New    Target Date  09/29/21      PT LONG TERM GOAL #5   Title Improve functional limitation score to 63    Time 6    Period Weeks    Status New    Target Date 09/29/21                    Plan - 08/18/21 1251     Clinical Impression Statement Patient presents with ~ 3 week history of Rt hip pain after irritating the hip when playing pickle ball. She has had persistent pain in the Rt hip area which is increased with movement, sit to standing, standing, walking. Patient has limited hip mobility with tightness in the piriformis musculature into gluts. She will benefit from PT to address problems identified.    Stability/Clinical Decision Making Stable/Uncomplicated    Clinical Decision Making Low    Rehab Potential Good    PT Frequency 2x / week    PT Duration 6 weeks    PT Treatment/Interventions ADLs/Self Care Home Management;Aquatic Therapy;Cryotherapy;Electrical Stimulation;Iontophoresis 4mg /ml Dexamethasone;Moist Heat;Ultrasound;Gait training;Stair training;Functional mobility training;Therapeutic activities;Therapeutic exercise;Balance training;Neuromuscular re-education;Manual techniques;Passive range of motion;Dry needling;Taping    PT Next Visit Plan review HEP and response to DN; progress with stretching and strengthening Rt posterior hip; modalities as indicated    PT Home Exercise Plan    Consulted and Agree with Plan of Care Patient             Patient will benefit from skilled therapeutic intervention in order to improve the following deficits and impairments:  Abnormal gait, Decreased range of motion, Increased fascial restricitons, Decreased activity tolerance, Pain, Hypomobility, Decreased balance, Impaired flexibility, Improper body mechanics, Decreased mobility, Decreased strength, Postural dysfunction  Visit Diagnosis: Trochanteric bursitis of right hip  Other symptoms and signs involving the musculoskeletal system  Other abnormalities of gait and  mobility     Problem List Patient Active Problem List   Diagnosis Date Noted   Trochanteric bursitis, right hip 08/16/2021   Chronic right sacroiliac joint pain 11/01/2019   Mild ankle edema 11/01/2019   Nocturia 11/14/2018   Gastroesophageal reflux disease 11/14/2018   BMI 32.0-32.9,adult 11/14/2018   CKD stage G3a/A1, GFR 45-59 and albumin creatinine ratio <30 mg/g (HCC) 08/23/2016   D-dimer, elevated 06/16/2015   Osteopenia 05/21/2014   Former smoker 05/21/2014   Hyperlipidemia 05/06/2014   Insomnia 05/06/2014   Overweight (BMI 25.0-29.9) 05/06/2014   S/P wrist surgery 04/15/2014   Closed fracture of left distal radius 03/27/2014    Alexis Fisher 03/29/2014, PT, MPH  08/18/2021, 1:00 PM  [facility] [address] [place] [address] [place], [place], [place] Phone: [phone]   Fax:  [phone]  Name: Alexis Fisher MRN: [id number] Date of Birth: 10/20/1950

## 2021-08-24 ENCOUNTER — Encounter: Payer: Self-pay | Admitting: Rehabilitative and Restorative Service Providers"

## 2021-08-24 ENCOUNTER — Ambulatory Visit: Payer: Medicare PPO | Admitting: Rehabilitative and Restorative Service Providers"

## 2021-08-24 DIAGNOSIS — R29898 Other symptoms and signs involving the musculoskeletal system: Secondary | ICD-10-CM | POA: Diagnosis not present

## 2021-08-24 DIAGNOSIS — M7061 Trochanteric bursitis, right hip: Secondary | ICD-10-CM

## 2021-08-24 DIAGNOSIS — R2689 Other abnormalities of gait and mobility: Secondary | ICD-10-CM

## 2021-08-25 ENCOUNTER — Ambulatory Visit: Payer: Medicare PPO | Admitting: Sports Medicine

## 2021-08-27 ENCOUNTER — Ambulatory Visit: Payer: Medicare PPO | Admitting: Physical Therapy

## 2021-09-02 ENCOUNTER — Encounter: Payer: Self-pay | Admitting: Rehabilitative and Restorative Service Providers"

## 2021-09-02 ENCOUNTER — Ambulatory Visit: Payer: Medicare PPO | Attending: Sports Medicine | Admitting: Rehabilitative and Restorative Service Providers"

## 2021-09-02 DIAGNOSIS — R2689 Other abnormalities of gait and mobility: Secondary | ICD-10-CM

## 2021-09-02 DIAGNOSIS — M7061 Trochanteric bursitis, right hip: Secondary | ICD-10-CM

## 2021-09-02 DIAGNOSIS — R29898 Other symptoms and signs involving the musculoskeletal system: Secondary | ICD-10-CM | POA: Diagnosis not present

## 2021-09-02 NOTE — Therapy (Signed)
[facility] [address] [place], [place], [address] Phone: [phone]   Fax:  [phone]  Physical Therapy Treatment  Rationale for Evaluation and Treatment Rehabilitation  Patient Details  Name: Alexis Fisher MRN: [id number] Date of Birth: Nov 11, 1950 Referring Provider (PT): Dr Benjamin Stain   Encounter Date: 09/02/2021   PT End of Session - 09/02/21 1616     Visit Number 3    Number of Visits 12    Date for PT Re-Evaluation 09/29/21    PT Start Time 1613    PT Stop Time 1701    PT Time Calculation (min) 48 min    Activity Tolerance Patient tolerated treatment well             History reviewed. No pertinent past medical history.  Past Surgical History:  Procedure Laterality Date   wrsit surgery, plate Left 03/7060   Dr. Dewaine Oats    There were no vitals filed for this visit.   Subjective Assessment - 09/02/21 1706     Subjective Much worse in the past few days - out of town riding for 1.5 hours, then 5 hours x 2 trips; up and down stairs; playing 18 holes of golf; etc. Now with increased pain in the Rt posterior hip and in the grion area as well as intermittent pain into the Rt LE.    Currently in Pain? Yes    Pain Score 5     Pain Location Hip    Pain Orientation Right;Posterior;Anterior    Pain Descriptors / Indicators Aching;Nagging    Pain Onset More than a month ago    Pain Frequency Constant                [facility] PT Assessment - 09/02/21 0001       Assessment   Medical Diagnosis Rt trochanteric bursitis    Referring Provider (PT) Dr Benjamin Stain    Onset Date/Surgical Date 07/29/21    Hand Dominance Right    Next MD Visit PRN    Prior Therapy none      Posture/Postural Control   Posture Comments flexed forward at hips; wt shifted to the Lt in standing      Flexibility   Hamstrings tight Rt    Quadriceps tight Rt    ITB tight Rt    Piriformis tight Rt      Palpation    Palpation comment tightness Rt posterior hip through piriformis, glut med into Rt psoas      Ambulation/Gait   Gait Comments limp Rt LE in wt bearing Rt LE                           [facility] Adult PT Treatment/Exercise - 09/02/21 0001       Self-Care   Other Self-Care Comments  myofacial ball release work standing anterior and posterior Rt hip      Knee/Hip Exercises: Stretches   Hip Flexor Stretch Right;Left;2 reps;20 seconds    Hip Flexor Stretch Limitations supine thomas and sitting    Piriformis Stretch Right;2 reps;30 seconds    Piriformis Stretch Limitations supine travell with strap as needed varying angles      Knee/Hip Exercises: Supine   Bridges Strengthening;Both;[employer] Limitations 5 sec hold    Other Supine Knee/Hip Exercises glut set 10 sec x 10 reps    Other Supine Knee/Hip Exercises alternate hip abduction x 5 each side 3 sec hold  Moist Heat Therapy   Number Minutes Moist Heat 10 Minutes    Moist Heat Location Lumbar Spine;Hip      Electrical Stimulation   Electrical Stimulation Location Rt posterior hip    Electrical Stimulation Action TENS    Electrical Stimulation Parameters to tolerance    Electrical Stimulation Goals Pain;Tone      Manual Therapy   Manual therapy comments skilled palpation to assess response to DN and manual therapy    Joint Mobilization PA mobs Rt hip pt prone    Soft tissue mobilization deep tissue work Rt posterior hip    Myofascial Release Rt posterior hip    Passive ROM IR/ER Rt hip pt prone hip extended and knee flexed              Trigger Point Dry Needling - 09/02/21 0001     Consent Given? Yes    Education Handout Provided Previously provided    Statistician Performed with Dry Needling Yes    E-stim with Dry Needling Details mAmp x 5 min    Other Dry Needling Rt    Gluteus Medius Response Palpable increased muscle length    Gluteus Maximus Response Palpable increased muscle  length;Twitch response elicited    Piriformis Response Palpable increased muscle length;Twitch response elicited                   PT Education - 09/02/21 1637     Education Details TENS; posture and body mechanics    Person(s) Educated Patient    Methods Explanation;Demonstration;Tactile cues;Verbal cues;Handout    Comprehension Verbalized understanding;Returned demonstration;Verbal cues required;Tactile cues required                 PT Long Term Goals - 08/18/21 1255       PT LONG TERM GOAL #1   Title Decrease Rt hip/LE pain and limitations by 75-100% allowing patient to return to all normal functional activities    Time 6    Period Weeks    Status New    Target Date 09/29/21      PT LONG TERM GOAL #2   Title Improve hip mobility through the piriformis/gluts with patient to demonstrate good range with no pulling or pain with stretching    Time 6    Period Weeks    Status New    Target Date 09/29/21      PT LONG TERM GOAL #3   Title Normal transfers and gait pattern with no pain and no limp Rt LE    Time 6    Period Weeks    Status New    Target Date 09/29/21      PT LONG TERM GOAL #4   Title Independent in HEP (including aquatic program as indicated)    Time 6    Period Weeks    Status New    Target Date 09/29/21      PT LONG TERM GOAL #5   Title Improve functional limitation score to 63    Time 6    Period Weeks    Status New    Target Date 09/29/21                   Plan - 09/02/21 1616     Clinical Impression Statement Increased pain in the Rt posterior and anterior hip as well as some pain into the Rt LE over the past few days. Patient has been out of town - riding in the car  for 5 hours there and back and up and down steps etc... different activity level. She did her exercises only twice while away but did add them back today. Note continued tightness in the Rt piriformis and gluts as well as Rt psoas musculature. Good response  to DN and manual work. Reviewed HEP and advised patient to avoid proonged sitting, twisting, bending activities. Trial of TENS for home management of pain.    Rehab Potential Good    PT Frequency 2x / week    PT Duration 6 weeks    PT Treatment/Interventions ADLs/Self Care Home Management;Aquatic Therapy;Cryotherapy;Electrical Stimulation;Iontophoresis 4mg /ml Dexamethasone;Moist Heat;Ultrasound;Gait training;Stair training;Functional mobility training;Therapeutic activities;Therapeutic exercise;Balance training;Neuromuscular re-education;Manual techniques;Passive range of motion;Dry needling;Taping    PT Next Visit Plan review and progress HEP; continue DN; progress with stretching and strengthening Rt posterior hip; modalities as indicated    PT Home Exercise Plan    Consulted and Agree with Plan of Care Patient             Patient will benefit from skilled therapeutic intervention in order to improve the following deficits and impairments:     Visit Diagnosis: Trochanteric bursitis of right hip  Other symptoms and signs involving the musculoskeletal system  Other abnormalities of gait and mobility     Problem List Patient Active Problem List   Diagnosis Date Noted   Trochanteric bursitis, right hip 08/16/2021   Chronic right sacroiliac joint pain 11/01/2019   Mild ankle edema 11/01/2019   Nocturia 11/14/2018   Gastroesophageal reflux disease 11/14/2018   BMI 32.0-32.9,adult 11/14/2018   CKD stage G3a/A1, GFR 45-59 and albumin creatinine ratio <30 mg/g (HCC) 08/23/2016   D-dimer, elevated 06/16/2015   Osteopenia 05/21/2014   Former smoker 05/21/2014   Hyperlipidemia 05/06/2014   Insomnia 05/06/2014   Overweight (BMI 25.0-29.9) 05/06/2014   S/P wrist surgery 04/15/2014   Closed fracture of left distal radius 03/27/2014    Alexis Fisher 03/29/2014, PT, MPH  09/02/2021, 5:19 PM  [facility] [address]  [address] [place], [place], [place] Phone: [phone]   Fax:  [phone]  Name: Alexis Fisher MRN: Napoleon Form Date of Birth: 1950-07-06

## 2021-09-02 NOTE — Patient Instructions (Addendum)
TENS UNIT: This is helpful for muscle pain and spasm.   Search and Purchase a TENS 7000 2nd edition at www.tenspros.com. It should be less than $30.     TENS unit instructions: Do not shower or bathe with the unit on Turn the unit off before removing electrodes or batteries If the electrodes lose stickiness add a drop of water to the electrodes after they are disconnected from the unit and place on plastic sheet. If you continued to have difficulty, call the TENS unit company to purchase more electrodes. Do not apply lotion on the skin area prior to use. Make sure the skin is clean and dry as this will help prolong the life of the electrodes. After use, always check skin for unusual red areas, rash or other skin difficulties. If there are any skin problems, does not apply electrodes to the same area. Never remove the electrodes from the unit by pulling the wires. Do not use the TENS unit or electrodes other than as directed. Do not change electrode placement without consultating your therapist or physician. Keep 2 fingers with between each electrode.   Sleeping on Back  Place pillow under knees. A pillow with cervical support and a roll around waist are also helpful. Copyright  VHI. All rights reserved.  Sleeping on Side Place pillow between knees. Use cervical support under neck and a roll around waist as needed. Copyright  VHI. All rights reserved.   Sleeping on Stomach   If this is the only desirable sleeping position, place pillow under lower legs, and under stomach or chest as needed.  Posture - Sitting   Sit upright, head facing forward. Try using a roll to support lower back. Keep shoulders relaxed, and avoid rounded back. Keep hips level with knees. Avoid crossing legs for long periods. Stand to Sit / Sit to Stand   To sit: Bend knees to lower self onto front edge of chair, then scoot back on seat. To stand: Reverse sequence by placing one foot forward, and scoot to  front of seat. Use rocking motion to stand up.   Work Height and Reach  Ideal work height is no more than 2 to 4 inches below elbow level when standing, and at elbow level when sitting. Reaching should be limited to arm's length, with elbows slightly bent.  Bending  Bend at hips and knees, not back. Keep feet shoulder-width apart.    Posture - Standing   Good posture is important. Avoid slouching and forward head thrust. Maintain curve in low back and align ears over shoul- ders, hips over ankles.  Alternating Positions   Alternate tasks and change positions frequently to reduce fatigue and muscle tension. Take rest breaks. Computer Work   Position work to Programmer, multimedia. Use proper work and seat height. Keep shoulders back and down, wrists straight, and elbows at right angles. Use chair that provides full back support. Add footrest and lumbar roll as needed.  Getting Into / Out of Car  Lower self onto seat, scoot back, then bring in one leg at a time. Reverse sequence to get out.  Dressing  Lie on back to pull socks or slacks over feet, or sit and bend leg while keeping back straight.    Housework - Sink  Place one foot on ledge of cabinet under sink when standing at sink for prolonged periods.   Pushing / Pulling  Pushing is preferable to pulling. Keep back in proper alignment, and use leg muscles to do the  work.  Deep Squat   Squat and lift with both arms held against upper trunk. Tighten stomach muscles without holding breath. Use smooth movements to avoid jerking.  Avoid Twisting   Avoid twisting or bending back. Pivot around using foot movements, and bend at knees if needed when reaching for articles.  Carrying Luggage   Distribute weight evenly on both sides. Use a cart whenever possible. Do not twist trunk. Move body as a unit.   Lifting Principles .Maintain proper posture and head alignment. .Slide object as close as possible before lifting. .Move  obstacles out of the way. .Test before lifting; ask for help if too heavy. .Tighten stomach muscles without holding breath. .Use smooth movements; do not jerk. .Use legs to do the work, and pivot with feet. .Distribute the work load symmetrically and close to the center of trunk. .Push instead of pull whenever possible.   Ask For Help   Ask for help and delegate to others when possible. Coordinate your movements when lifting together, and maintain the low back curve.  Log Roll   Lying on back, bend left knee and place left arm across chest. Roll all in one movement to the right. Reverse to roll to the left. Always move as one unit. Housework - Sweeping  Use long-handled equipment to avoid stooping.   Housework - Wiping  Position yourself as close as possible to reach work surface. Avoid straining your back.  Laundry - Unloading Wash   To unload small items at bottom of washer, lift leg opposite to arm being used to reach.  Gardening - Raking  Move close to area to be raked. Use arm movements to do the work. Keep back straight and avoid twisting.     Cart  When reaching into cart with one arm, lift opposite leg to keep back straight.   Getting Into / Out of Bed  Lower self to lie down on one side by raising legs and lowering head at the same time. Use arms to assist moving without twisting. Bend both knees to roll onto back if desired. To sit up, start from lying on side, and use same move-ments in reverse. Housework - Vacuuming  Hold the vacuum with arm held at side. Step back and forth to move it, keeping head up. Avoid twisting.   Laundry - Loading Wash  Position laundry basket so that bending and twisting can be avoided.   Laundry - Unloading Dryer  Squat down to reach into clothes dryer or use a reacher.  Gardening - Weeding / Planting  Squat or Kneel. Knee pads may be helpful.                     

## 2021-09-06 ENCOUNTER — Ambulatory Visit: Payer: Medicare PPO | Admitting: Rehabilitative and Restorative Service Providers"

## 2021-09-06 ENCOUNTER — Encounter: Payer: Self-pay | Admitting: Rehabilitative and Restorative Service Providers"

## 2021-09-06 DIAGNOSIS — R2689 Other abnormalities of gait and mobility: Secondary | ICD-10-CM | POA: Diagnosis not present

## 2021-09-06 DIAGNOSIS — R29898 Other symptoms and signs involving the musculoskeletal system: Secondary | ICD-10-CM

## 2021-09-06 DIAGNOSIS — M7061 Trochanteric bursitis, right hip: Secondary | ICD-10-CM

## 2021-09-06 NOTE — Therapy (Signed)
[facility] [address] [address] [place], [place], [address] Phone: [phone]   Fax:  [phone]  Physical Therapy Treatment  Rationale for Evaluation and Treatment Rehabilitation  Patient Details  Name: Alexis Fisher MRN: [id number] Date of Birth: Jul 11, 1950 Referring Provider (PT): Dr Benjamin Stain   Encounter Date: 09/06/2021   PT End of Session - 09/06/21 1154     Visit Number 4    Number of Visits 12    Date for PT Re-Evaluation 09/29/21    PT Start Time 1148    PT Stop Time 1236    PT Time Calculation (min) 48 min    Activity Tolerance Patient tolerated treatment well             History reviewed. No pertinent past medical history.  Past Surgical History:  Procedure Laterality Date   wrsit surgery, plate Left 09/7865   Dr. Dewaine Oats    There were no vitals filed for this visit.   Subjective Assessment - 09/06/21 1155     Subjective Patient reports that she felt better after last treatment but then had incresaed pain in the hip and posterior thigh. She has been trying to watch her bending activities. She is getting in and out of the floor to do her exercises. Suggested that she do her exercises on the bed for now. She has been sitting in recliner with a pillow. Suggested patient try sitting with swim noodle.    Currently in Pain? Yes    Pain Score 4     Pain Location Hip    Pain Orientation Right;Posterior;Anterior    Pain Descriptors / Indicators Aching;Nagging    Pain Type Acute pain    Pain Radiating Towards Rt posterior hip to thigh    Pain Onset More than a month ago    Pain Frequency Constant                               [facility] Adult PT Treatment/Exercise - 09/06/21 0001       Ambulation/Gait   Gait Comments limp Rt LE in wt bearing Rt LE      Posture/Postural Control   Posture Comments flexed forward at hips; wt shifted to the Lt in standing      Self-Care   Other  Self-Care Comments  myofacial ball release work standing anterior and posterior Rt hip      Knee/Hip Exercises: Stretches   Passive Hamstring Stretch Left;2 reps;30 seconds   supine with strap   ITB Stretch Right;2 reps;30 seconds   supine with strap     Knee/Hip Exercises: Supine   Bridges Strengthening;Both;[employer] Limitations 5 sec hold    Knee Flexion Limitations hooklying 3 part core 10 sec x 5 reps      Manual Therapy   Manual therapy comments skilled palpation to assess response to DN and manual therapy    Joint Mobilization PA mobs Rt hip pt prone    Soft tissue mobilization deep tissue work Rt posterior hip    Myofascial Release Rt posterior hip    Passive ROM IR/ER Rt hip pt prone hip extended and knee flexed              Trigger Point Dry Needling - 09/06/21 0001     Consent Given? Yes    Education Handout Provided Previously provided    Electrical Stimulation Performed with Dry Needling Yes  E-stim with Dry Needling Details mAmp x 5 min    Other Dry Needling Rt    Gluteus Medius Response Palpable increased muscle length    Gluteus Maximus Response Palpable increased muscle length;Twitch response elicited    Piriformis Response Palpable increased muscle length;Twitch response elicited    Tensor Fascia Lata Response Palpable increased muscle length                        PT Long Term Goals - 08/18/21 1255       PT LONG TERM GOAL #1   Title Decrease Rt hip/LE pain and limitations by 75-100% allowing patient to return to all normal functional activities    Time 6    Period Weeks    Status New    Target Date 09/29/21      PT LONG TERM GOAL #2   Title Improve hip mobility through the piriformis/gluts with patient to demonstrate good range with no pulling or pain with stretching    Time 6    Period Weeks    Status New    Target Date 09/29/21      PT LONG TERM GOAL #3   Title Normal transfers and gait pattern with no pain and no  limp Rt LE    Time 6    Period Weeks    Status New    Target Date 09/29/21      PT LONG TERM GOAL #4   Title Independent in HEP (including aquatic program as indicated)    Time 6    Period Weeks    Status New    Target Date 09/29/21      PT LONG TERM GOAL #5   Title Improve functional limitation score to 63    Time 6    Period Weeks    Status New    Target Date 09/29/21                   Plan - 09/06/21 1233     Clinical Impression Statement Mailynn reports initially feeling much better following last treatment but pain increased with exercises. She was instructed in modification of exercises and to do exercises lying on the bed instead of in the floor. She will continue to modify sitting postion at home and continue with walking but avoiding bending, lifting, prolonged positions. Patient has decreased palpable tightness in the Rt posterior hip but persistent tightness in the Rt posteriorlateral greater trochanter. Good response to DN and manual work. She continues to have abnormal gait pattern.    Rehab Potential Good    PT Frequency 2x / week    PT Duration 6 weeks    PT Treatment/Interventions ADLs/Self Care Home Management;Aquatic Therapy;Cryotherapy;Electrical Stimulation;Iontophoresis 4mg /ml Dexamethasone;Moist Heat;Ultrasound;Gait training;Stair training;Functional mobility training;Therapeutic activities;Therapeutic exercise;Balance training;Neuromuscular re-education;Manual techniques;Passive range of motion;Dry needling;Taping    PT Next Visit Plan review and progress HEP; continue DN; progress with stretching and strengthening Rt posterior hip; modalities as indicated    PT Home Exercise Plan    Consulted and Agree with Plan of Care Patient             Patient will benefit from skilled therapeutic intervention in order to improve the following deficits and impairments:     Visit Diagnosis: Trochanteric bursitis of right hip  Other symptoms and  signs involving the musculoskeletal system  Other abnormalities of gait and mobility     Problem List Patient Active Problem List   Diagnosis  Date Noted   Trochanteric bursitis, right hip 08/16/2021   Chronic right sacroiliac joint pain 11/01/2019   Mild ankle edema 11/01/2019   Nocturia 11/14/2018   Gastroesophageal reflux disease 11/14/2018   BMI 32.0-32.9,adult 11/14/2018   CKD stage G3a/A1, GFR 45-59 and albumin creatinine ratio <30 mg/g (HCC) 08/23/2016   D-dimer, elevated 06/16/2015   Osteopenia 05/21/2014   Former smoker 05/21/2014   Hyperlipidemia 05/06/2014   Insomnia 05/06/2014   Overweight (BMI 25.0-29.9) 05/06/2014   S/P wrist surgery 04/15/2014   Closed fracture of left distal radius 03/27/2014    Ani Deoliveira Rober Minion, PT, MPH  09/06/2021, 1:10 PM  [facility] [address] [place], [place], [address] Phone: [phone]   Fax:  [phone]  Name: Keana Dueitt MRN: [id number] Date of Birth: [birth year]

## 2021-09-06 NOTE — Patient Instructions (Signed)
Access Code: [email or web address] URL: [email or web address] Date: 09/06/2021 Prepared by: Corlis Leak  Exercises - Supine Piriformis Stretch with Leg Straight  - 2 x daily - 7 x weekly - 1 sets - 3 reps - 30 sec  hold - Standing Hip Abduction with Counter Support  - 2 x daily - 7 x weekly - 2-3 sets - 10 reps - 2-3 sec  hold - Hip Flexor Stretch at Edge of Bed  - 2 x daily - 7 x weekly - 1 sets - 3 reps - 30 sec  hold - Seated Hip Flexor Stretch  - 2 x daily - 7 x weekly - 1 sets - 3 reps - 30 sec  hold - Hooklying Isometric Clamshell  - 2 x daily - 7 x weekly - 1 sets - 10 reps - 3 sec  hold - Standing Piriformis Release with Ball at [employer]  - 2 x daily - 7 x weekly - 30-60 sec  hold  Will work on these exercises this week until next PT visit  - Hooklying Hamstring Stretch with Strap  - 2 x daily - 7 x weekly - 1 sets - 3 reps - 30 sec  hold - Supine ITB Stretch with Strap  - 2 x daily - 7 x weekly - 1 sets - 3 reps - 30 sec  hold - Supine Transversus Abdominis Bracing with Pelvic Floor Contraction  - 2 x daily - 7 x weekly - 1 sets - 10 reps - 10sec  hold - Bridge  - 2 x daily - 7 x weekly - 1-2 sets - 10 reps - 3-5 sec  hold

## 2021-09-08 ENCOUNTER — Ambulatory Visit: Payer: Medicare PPO | Admitting: Rehabilitative and Restorative Service Providers"

## 2021-09-08 ENCOUNTER — Encounter: Payer: Self-pay | Admitting: Rehabilitative and Restorative Service Providers"

## 2021-09-08 DIAGNOSIS — R2689 Other abnormalities of gait and mobility: Secondary | ICD-10-CM

## 2021-09-08 DIAGNOSIS — R29898 Other symptoms and signs involving the musculoskeletal system: Secondary | ICD-10-CM

## 2021-09-08 DIAGNOSIS — M7061 Trochanteric bursitis, right hip: Secondary | ICD-10-CM | POA: Diagnosis not present

## 2021-09-08 NOTE — Therapy (Addendum)
[facility] [place] [address] [place], [place], [address] Phone: [phone]   Fax:  [phone]  Physical Therapy Treatment  Rationale for Evaluation and Treatment Rehabilitation  Patient Details  Name: Alexis Fisher MRN: [id number] Date of Birth: 02/21/1951 Referring Provider (PT): Dr Benjamin Stain   Encounter Date: 09/08/2021   PT End of Session - 09/08/21 1148     Visit Number 5    Number of Visits 12    Date for PT Re-Evaluation 09/29/21    PT Start Time 1145    PT Stop Time 1235    PT Time Calculation (min) 50 min    Activity Tolerance Patient tolerated treatment well             History reviewed. No pertinent past medical history.  Past Surgical History:  Procedure Laterality Date   wrsit surgery, plate Left 02/4968   Dr. Dewaine Oats    There were no vitals filed for this visit.   Subjective Assessment - 09/08/21 1148     Subjective Patient reports that she had no pain after treatment Monday and had no pain yesterday. She had some pain with trying to sleep last night and got up this morning this morning with pain but the pain is not as bad as it was.    Currently in Pain? Yes    Pain Score 3     Pain Location Hip    Pain Orientation Right;Posterior;Anterior    Pain Descriptors / Indicators Aching;Nagging    Pain Type Acute pain    Pain Onset More than a month ago    Pain Frequency Intermittent                               [facility] Adult PT Treatment/Exercise - 09/08/21 0001       Knee/Hip Exercises: Stretches   Other Knee/Hip Stretches nerve mobilization - supine hip flexed flexing and extending knee x ~ 10 reps      Knee/Hip Exercises: Standing   Hip Abduction AROM;Stengthening;Right;Left;10 reps;Knee straight    Hip Extension Stengthening;Right;Left;10 reps;Knee straight      Knee/Hip Exercises: Supine   Bridges Strengthening;Both;5 sets    Bridges Limitations 5 sec hold     Knee Flexion Limitations hooklying 3 part core 10 sec x 5 reps      Manual Therapy   Manual therapy comments skilled palpation to assess response to DN and manual therapy    Joint Mobilization PA mobs Rt hip pt prone    Soft tissue mobilization deep tissue work Rt posterior hip    Myofascial Release Rt posterior hip    Passive ROM IR/ER Rt hip pt prone hip extended and knee flexed              Trigger Point Dry Needling - 09/08/21 0001     Consent Given? Yes    Education Handout Provided Previously provided    Statistician Performed with Dry Needling Yes    E-stim with Dry Needling Details mAmp x 5 min    Other Dry Needling Rt    Quadriceps Response Palpable increased muscle length    Rectus femoris Response Palpable increased muscle length    Gluteus Medius Response Palpable increased muscle length    Gluteus Maximus Response Palpable increased muscle length;Twitch response elicited    Piriformis Response Palpable increased muscle length;Twitch response elicited    Tensor Fascia Lata Response Palpable increased muscle length  PT Education - 09/08/21 1221     Education Details HEP    Person(s) Educated Patient    Methods Explanation;Demonstration;Tactile cues;Verbal cues;Handout    Comprehension Verbalized understanding;Returned demonstration;Verbal cues required;Tactile cues required                 PT Long Term Goals - 08/18/21 1255       PT LONG TERM GOAL #1   Title Decrease Rt hip/LE pain and limitations by 75-100% allowing patient to return to all normal functional activities    Time 6    Period Weeks    Status New    Target Date 09/29/21      PT LONG TERM GOAL #2   Title Improve hip mobility through the piriformis/gluts with patient to demonstrate good range with no pulling or pain with stretching    Time 6    Period Weeks    Status New    Target Date 09/29/21      PT LONG TERM GOAL #3   Title Normal transfers  and gait pattern with no pain and no limp Rt LE    Time 6    Period Weeks    Status New    Target Date 09/29/21      PT LONG TERM GOAL #4   Title Independent in HEP (including aquatic program as indicated)    Time 6    Period Weeks    Status New    Target Date 09/29/21      PT LONG TERM GOAL #5   Title Improve functional limitation score to 63    Time 6    Period Weeks    Status New    Target Date 09/29/21                   Plan - 09/08/21 1200     Clinical Impression Statement Good response to last treatment. Continued with DN and deep tissue work through the anterior and posterior Rt hip. Tightness noted through hip flexors and posterior hip in piriformis/glut med. Good response to DN and manual work with 1.5 days without pain.    Rehab Potential Good    PT Frequency 2x / week    PT Duration 6 weeks    PT Treatment/Interventions ADLs/Self Care Home Management;Aquatic Therapy;Cryotherapy;Electrical Stimulation;Iontophoresis 4mg /ml Dexamethasone;Moist Heat;Ultrasound;Gait training;Stair training;Functional mobility training;Therapeutic activities;Therapeutic exercise;Balance training;Neuromuscular re-education;Manual techniques;Passive range of motion;Dry needling;Taping    PT Next Visit Plan review and progress HEP; continue DN; progress with stretching and strengthening Rt posterior hip; modalities as indicated    PT Home Exercise Plan    Consulted and Agree with Plan of Care Patient             Patient will benefit from skilled therapeutic intervention in order to improve the following deficits and impairments:     Visit Diagnosis: Trochanteric bursitis of right hip  Other symptoms and signs involving the musculoskeletal system  Other abnormalities of gait and mobility     Problem List Patient Active Problem List   Diagnosis Date Noted   Trochanteric bursitis, right hip 08/16/2021   Chronic right sacroiliac joint pain 11/01/2019   Mild  ankle edema 11/01/2019   Nocturia 11/14/2018   Gastroesophageal reflux disease 11/14/2018   BMI 32.0-32.9,adult 11/14/2018   CKD stage G3a/A1, GFR 45-59 and albumin creatinine ratio <30 mg/g (HCC) 08/23/2016   D-dimer, elevated 06/16/2015   Osteopenia 05/21/2014   Former smoker 05/21/2014   Hyperlipidemia 05/06/2014   Insomnia 05/06/2014  Overweight (BMI 25.0-29.9) 05/06/2014   S/P wrist surgery 04/15/2014   Closed fracture of left distal radius 03/27/2014    Aarya Quebedeaux Rober Minion, PT, MPH  09/08/2021, 12:44 PM  [facility] [address] [place] [address] [place], [place], [address] Phone: [phone]   Fax:  [phone]  Name: Gianella Chismar MRN: [id number] Date of Birth: Dec 26, 1950

## 2021-09-08 NOTE — Patient Instructions (Signed)
Access Code: [email or web address] URL: [email or web address] Date: 09/08/2021 Prepared by: Corlis Leak  Exercises - Supine Piriformis Stretch with Leg Straight  - 2 x daily - 7 x weekly - 1 sets - 3 reps - 30 sec  hold - Standing Hip Abduction with Counter Support  - 2 x daily - 7 x weekly - 2-3 sets - 10 reps - 2-3 sec  hold - Hip Flexor Stretch at Edge of Bed  - 2 x daily - 7 x weekly - 1 sets - 3 reps - 30 sec  hold - Seated Hip Flexor Stretch  - 2 x daily - 7 x weekly - 1 sets - 3 reps - 30 sec  hold - Hooklying Isometric Clamshell  - 2 x daily - 7 x weekly - 1 sets - 10 reps - 3 sec  hold - Standing Piriformis Release with Ball at Wall  - 2 x daily - 7 x weekly - 30-60 sec  hold - Hooklying Hamstring Stretch with Strap  - 2 x daily - 7 x weekly - 1 sets - 3 reps - 30 sec  hold - Supine ITB Stretch with Strap  - 2 x daily - 7 x weekly - 1 sets - 3 reps - 30 sec  hold - Supine Transversus Abdominis Bracing with Pelvic Floor Contraction  - 2 x daily - 7 x weekly - 1 sets - 10 reps - 10sec  hold - Bridge  - 2 x daily - 7 x weekly - 1-2 sets - 10 reps - 3-5 sec  hold - Standing Hip Extension with Counter Support  - 2 x daily - 7 x weekly - 1-2 sets - 10 reps - 3-5 sec  hold

## 2021-09-13 ENCOUNTER — Ambulatory Visit: Payer: Medicare PPO | Admitting: Rehabilitative and Restorative Service Providers"

## 2021-09-13 ENCOUNTER — Encounter: Payer: Self-pay | Admitting: Rehabilitative and Restorative Service Providers"

## 2021-09-13 DIAGNOSIS — R29898 Other symptoms and signs involving the musculoskeletal system: Secondary | ICD-10-CM | POA: Diagnosis not present

## 2021-09-13 DIAGNOSIS — M7061 Trochanteric bursitis, right hip: Secondary | ICD-10-CM

## 2021-09-13 DIAGNOSIS — R2689 Other abnormalities of gait and mobility: Secondary | ICD-10-CM | POA: Diagnosis not present

## 2021-09-13 NOTE — Therapy (Addendum)
[place] [place] [place], [place], [address] Phone: [phone]   Fax:  [phone]  Physical Therapy Treatment and Discharge Summary  PHYSICAL THERAPY DISCHARGE SUMMARY  Visits from Start of Care: 6  Current functional level related to goals / functional outcomes: See progress note for discharge status   Remaining deficits: Continued pain    Education / Equipment: HEP    Patient agrees to discharge. Patient goals were not met. Patient is being discharged due to not returning since the last visit.  Junell Cullifer P. Helene Kelp PT, MPH 10/14/21 4:45 PM   Rationale for Evaluation and Treatment Rehabilitation  Patient Details  Name: Alexis Fisher MRN: [id number] Date of Birth: [birth year] Referring Provider (PT): Dr Dianah Field   Encounter Date: 09/13/2021   PT End of Session - 09/13/21 1009     Visit Number 6    Number of Visits 12    Date for PT Re-Evaluation 09/29/21    PT Start Time 1009    PT Stop Time 1059    PT Time Calculation (min) 50 min    Activity Tolerance Patient tolerated treatment well             History reviewed. No pertinent past medical history.  Past Surgical History:  Procedure Laterality Date   wrsit surgery, plate Left 03/3760   Dr. Smitty Cords    There were no vitals filed for this visit.   Subjective Assessment - 09/13/21 1016     Subjective Patient reports that pain increases with walking distances. She has continued pain which is less than it was but still present. Not sleeping well. Working on her exercises which do not hurt.    Currently in Pain? Yes    Pain Score 3     Pain Location Hip    Pain Orientation Right;Anterior   "a little bit in the back of the hip"   Pain Descriptors / Indicators Aching;Nagging                [facility] PT Assessment - 09/13/21 0001       Assessment   Medical Diagnosis Rt trochanteric bursitis    Referring Provider (PT) Dr Dianah Field     Onset Date/Surgical Date 07/29/21    Hand Dominance Right    Next MD Visit 09/14/21    Prior Therapy none      Posture/Postural Control   Posture Comments flexed forward at hips; wt shifted to the Lt in standing      Strength   Overall Strength Comments decreased strength Rt hip abduction      Flexibility   Hamstrings tight Rt    Quadriceps WFL's    ITB tight Rt    Piriformis tight Rt      Palpation   Palpation comment tightness Rt posterior hip through piriformis, glut med into Rt psoas                           [facility] Adult PT Treatment/Exercise - 09/13/21 0001       Ambulation/Gait   Gait Comments limp Rt LE in wt bearing Rt LE      Self-Care   Other Self-Care Comments  myofacial ball release work standing anterior and posterior Rt hip      Knee/Hip Exercises: Stretches   Passive Hamstring Stretch Left;2 reps;30 seconds    Hip Flexor Stretch Right;Left;2 reps;20 seconds    Hip Flexor Stretch Limitations supine thomas and sitting  ITB Stretch Right;2 reps;30 seconds    Piriformis Stretch Right;2 reps;30 seconds    Piriformis Stretch Limitations supine travell with strap as needed varying angles      Knee/Hip Exercises: Standing   Hip Abduction AROM;Stengthening;Right;Left;10 reps;Knee straight    Hip Extension AROM;Stengthening;Right;Left;10 reps    Other Standing Knee Exercises standing lateral shift to Lt pushing with Rt LE hold 2-3 sec x 5 reps x 2 sets      Knee/Hip Exercises: Supine   Bridges Strengthening;Both;5 sets    Bridges Limitations 5 sec hold    Knee Flexion Limitations hooklying 3 part core 10 sec x 5 reps      Manual Therapy   Manual therapy comments skilled palpation to assess response to DN and manual therapy    Joint Mobilization PA mobs Rt hip pt prone    Soft tissue mobilization deep tissue work Rt posterior hip    Myofascial Release Rt posterior hip    Passive ROM IR/ER Rt hip pt prone hip extended and knee flexed               Trigger Point Dry Needling - 09/13/21 0001     Consent Given? Yes    Education Handout Provided Previously provided    Printmaker Performed with Dry Needling Yes    E-stim with Dry Needling Details mAmp x 5 min    Other Dry Needling Rt    Quadriceps Response Palpable increased muscle length    Gluteus Medius Response Palpable increased muscle length    Gluteus Maximus Response Palpable increased muscle length;Twitch response elicited    Piriformis Response Palpable increased muscle length;Twitch response elicited    Tensor Fascia Lata Response Palpable increased muscle length                        PT Long Term Goals - 08/18/21 1255       PT LONG TERM GOAL #1   Title Decrease Rt hip/LE pain and limitations by 75-100% allowing patient to return to all normal functional activities    Time 6    Period Weeks    Status New    Target Date 09/29/21      PT LONG TERM GOAL #2   Title Improve hip mobility through the piriformis/gluts with patient to demonstrate good range with no pulling or pain with stretching    Time 6    Period Weeks    Status New    Target Date 09/29/21      PT LONG TERM GOAL #3   Title Normal transfers and gait pattern with no pain and no limp Rt LE    Time 6    Period Weeks    Status New    Target Date 09/29/21      PT LONG TERM GOAL #4   Title Independent in HEP (including aquatic program as indicated)    Time 6    Period Weeks    Status New    Target Date 09/29/21      PT LONG TERM GOAL #5   Title Improve functional limitation score to 63    Time 6    Period Weeks    Status New    Target Date 09/29/21                   Plan - 09/13/21 1047     Clinical Impression Statement Good response to DN and manual work as well as exercises  but symptoms return 1-2 days after treatment. Note continued tightness through hip flexors and posterior hip in piriformis/glut medius as well as pain with palpation over  greater trochanter. Abnormal gait with Trendelenburg with wt bearing Rt. Continues to report and exhibit symptoms inconsistent with bursitis. Patient will return to MD for reassessment tomorrow.    Rehab Potential Good    PT Frequency 2x / week    PT Duration 6 weeks    PT Treatment/Interventions ADLs/Self Care Home Management;Aquatic Therapy;Cryotherapy;Electrical Stimulation;Iontophoresis 65m/ml Dexamethasone;Moist Heat;Ultrasound;Gait training;Stair training;Functional mobility training;Therapeutic activities;Therapeutic exercise;Balance training;Neuromuscular re-education;Manual techniques;Passive range of motion;Dry needling;Taping    PT Next Visit Plan review and progress HEP; continue DN; progress with stretching and strengthening Rt posterior hip; modalities as indicated; note to MD    PT Home Exercise Plan [email or web address]   Consulted and Agree with Plan of Care Patient             Patient will benefit from skilled therapeutic intervention in order to improve the following deficits and impairments:     Visit Diagnosis: Trochanteric bursitis of right hip  Other symptoms and signs involving the musculoskeletal system  Other abnormalities of gait and mobility     Problem List Patient Active Problem List   Diagnosis Date Noted   Trochanteric bursitis, right hip 08/16/2021   Chronic right sacroiliac joint pain 11/01/2019   Mild ankle edema 11/01/2019   Nocturia 11/14/2018   Gastroesophageal reflux disease 11/14/2018   BMI 32.0-32.9,adult 11/14/2018   CKD stage G3a/A1, GFR 45-59 and albumin creatinine ratio <30 mg/g (HCC) 08/23/2016   D-dimer, elevated 06/16/2015   Osteopenia 05/21/2014   Former smoker 05/21/2014   Hyperlipidemia 05/06/2014   Insomnia 05/06/2014   Overweight (BMI 25.0-29.9) 05/06/2014   S/P wrist surgery 04/15/2014   Closed fracture of left distal radius 03/27/2014    Alexis Fisher PNilda Fisher PT, MPH  09/13/2021, 12:09 PM  [facility] [address] [address]Phone: [phone]  Fax:  [phone] Name: Alexis KoopmanMRN: [id number] of Birth: 127-Aug-1952

## 2021-09-14 ENCOUNTER — Ambulatory Visit (INDEPENDENT_AMBULATORY_CARE_PROVIDER_SITE_OTHER): Payer: Medicare PPO | Admitting: Sports Medicine

## 2021-09-14 ENCOUNTER — Ambulatory Visit (INDEPENDENT_AMBULATORY_CARE_PROVIDER_SITE_OTHER): Payer: Medicare PPO

## 2021-09-14 DIAGNOSIS — M7061 Trochanteric bursitis, right hip: Secondary | ICD-10-CM

## 2021-09-14 NOTE — Patient Instructions (Signed)
Hip Rehabilitation Protocol:  1.  Side leg raises.  3x30 with no weight, then 3x15 with 2 lb ankle weight, then 3x15 with 5 lb ankle weight 

## 2021-09-14 NOTE — Assessment & Plan Note (Signed)
Pleasant 71 year old female, we diagnosed her with greater trochanteric bursitis, she did have significant weakness to hip abduction. Internal rotation flexion, extension were all good. She has been doing some physical therapy but really has not improved with regards to her strength. We did a trochanteric bursa injection today, and I did add side leg raises. This will not improve unless her strength improves. Return to see me in 6 weeks. She does have a trip coming up to [place] in September so she needs to be good by then, if she has a recurrence of pain I am happy to do an early injection before she leaves.

## 2021-09-14 NOTE — Progress Notes (Signed)
    Procedures performed today:    Procedure: Real-time Ultrasound Guided injection of the right greater trochanteric bursa Device: Samsung HS60  Verbal informed consent obtained.  Time-out conducted.  Noted no overlying erythema, induration, or other signs of local infection.  Skin prepped in a sterile fashion.  Local anesthesia: Topical Ethyl chloride.  With sterile technique and under real time ultrasound guidance: Mild bursitis, 1 cc Kenalog 40, 2 cc lidocaine, 2 cc bupivacaine injected easily Completed without difficulty  Advised to call if fevers/chills, erythema, induration, drainage, or persistent bleeding.  Images permanently stored and available for review in PACS.  Impression: Technically successful ultrasound guided injection.  Independent interpretation of notes and tests performed by another provider:   None.  Brief History, Exam, Impression, and Recommendations:    Trochanteric bursitis, right hip Alexis Fisher 71 year old female, we diagnosed her with greater trochanteric bursitis, she did have significant weakness to hip abduction. Internal rotation flexion, extension were all good. She has been doing some physical therapy but really has not improved with regards to her strength. We did a trochanteric bursa injection today, and I did add side leg raises. This will not improve unless her strength improves. Return to see me in 6 weeks. She does have a trip coming up to [place] in September so she needs to be good by then, if she has a recurrence of pain I am happy to do an early injection before she leaves.    ____________________________________________ [name]. Benjamin Stain, M.D., ABFM., CAQSM., AME. Primary Care and Sports Medicine [facility]  Adjunct Professor of Family Medicine  [employer]  Restaurant manager, fast food

## 2021-09-15 ENCOUNTER — Ambulatory Visit: Payer: Medicare PPO | Admitting: Rehabilitative and Restorative Service Providers"

## 2021-09-27 ENCOUNTER — Ambulatory Visit: Payer: Medicare PPO | Admitting: Rehabilitative and Restorative Service Providers"

## 2021-09-30 ENCOUNTER — Encounter: Payer: Medicare PPO | Admitting: Rehabilitative and Restorative Service Providers"

## 2021-10-08 ENCOUNTER — Telehealth: Payer: Self-pay

## 2021-10-08 NOTE — Telephone Encounter (Signed)
Patient aware of recommendations.  

## 2021-10-08 NOTE — Telephone Encounter (Signed)
Patient called for clarification on the exercises. Is she supposed to do 3 sets of 10 just once daily OR 3 times daily?

## 2021-10-08 NOTE — Telephone Encounter (Signed)
1 set of 10, 3 times daily, she can space the sets out as she desires.

## 2021-10-25 ENCOUNTER — Ambulatory Visit (INDEPENDENT_AMBULATORY_CARE_PROVIDER_SITE_OTHER): Payer: Medicare PPO | Admitting: Family Medicine

## 2021-10-25 ENCOUNTER — Telehealth: Payer: Self-pay

## 2021-10-25 VITALS — BP 111/62 | HR 71 | Ht 67.5 in | Wt 197.6 lb

## 2021-10-25 DIAGNOSIS — Z0000 Encounter for general adult medical examination without abnormal findings: Secondary | ICD-10-CM

## 2021-10-25 NOTE — Telephone Encounter (Signed)
Since traveling in 2 weeks I would go ahead and get current booster now that way she has at least a week or 2 before she travels and she can always get the updated booster in 4 months.

## 2021-10-25 NOTE — Progress Notes (Signed)
MEDICARE ANNUAL WELLNESS VISIT  10/25/2021  Subjective:  Alexis Fisher is a 71 y.o. female patient of Metheney, Barbarann Ehlers, MD who had a Medicare Annual Wellness Visit today. Alexis Fisher is Retired and lives with their spouse. she has 2 children. she reports that she is socially active and does interact with friends/family regularly. she is moderately physically active and enjoys  playing golf, pickle ball, walking and reading.  Patient Care Team: Agapito Games, MD as PCP - General (Family Medicine) Monica Becton, MD as Consulting Physician (Family Medicine)     10/25/2021    3:11 PM 08/18/2021   10:12 AM 10/23/2020    1:11 PM 11/14/2018    9:18 AM 08/24/2017    9:42 AM 11/06/2014    9:15 AM 05/06/2014    2:37 PM  Advanced Directives  Does Patient Have a Medical Advance Directive? Yes Yes Yes No Yes No No  Type of Advance Directive Living will Healthcare Power of [place];Living will Living will;Healthcare Power of [employer] Power of [place];Living will    Does patient want to make changes to medical advance directive? No - Patient declined  No - Patient declined      Copy of Healthcare Power of Attorney in Chart?  No - copy requested No - copy requested  No - copy requested    Would patient like information on creating a medical advance directive?    No - Patient declined  No - patient declined information No - patient declined information    Hospital Utilization Over the Past 12 Months: # of hospitalizations or ER visits: 0 # of surgeries: 0  Review of Systems    Patient reports that her overall health is unchanged, other than muscle injury when compared to last year.  Review of Systems: History obtained from chart review and the patient  All other systems negative.  Pain Assessment Pain : No/denies pain     Current Medications & Allergies (verified) Allergies as of 10/25/2021   No Known Allergies      Medication List        Accurate as of October 25, 2021  3:30 PM. If you have any questions, ask your nurse or doctor.          atorvastatin 40 MG tablet Commonly known as: LIPITOR Take 1 tablet (40 mg total) by mouth daily. AT 6 P.M.   cholecalciferol 1000 units tablet Commonly known as: VITAMIN D Take 1,000 Units by mouth daily.   clobetasol ointment 0.05 % Commonly known as: TEMOVATE Apply 1 application. topically 2 (two) times daily. X 2 weeks and then give the skin a break.        History (reviewed): History reviewed. No pertinent past medical history. Past Surgical History:  Procedure Laterality Date   wrsit surgery, plate Left 06/8097   Dr. Dewaine Oats   Family History  Problem Relation Age of Onset   Kidney failure Father    Social History   Socioeconomic History   Marital status: Married    Spouse name: Ramon Dredge   Number of children: 2   Years of education: 16   Highest education level: Bachelor's degree (e.g., BA, AB, BS)  Occupational History   Occupation: retired    Comment: Works part-time as a Lawyer.  Tobacco Use   Smoking status: Former    Types: Cigarettes    Quit date: 02/28/1998    Years since quitting: 23.6   Smokeless tobacco: Never  Vaping Use   Vaping  Use: Never used  Substance and Sexual Activity   Alcohol use: Yes    Alcohol/week: 3.0 - 4.0 standard drinks of alcohol    Types: 3 - 4 Cans of beer per week    Comment: wine and beer   Drug use: No   Sexual activity: Not Currently    Partners: Male  Other Topics Concern   Not on file  Social History Narrative   Lives with her husband. She enjoys playing golf, pickle ball, walking and reading.   Social Determinants of Health   Financial Resource Strain: Low Risk  (10/25/2021)   Overall Financial Resource Strain (CARDIA)    Difficulty of Paying Living Expenses: Not hard at all  Food Insecurity: No Food Insecurity (10/25/2021)   Hunger Vital Sign    Worried About Running Out of Food in the Last Year: Never true    Ran  Out of Food in the Last Year: Never true  Transportation Needs: No Transportation Needs (10/25/2021)   PRAPARE - Administrator, Civil Service (Medical): No    Lack of Transportation (Non-Medical): No  Physical Activity: Sufficiently Active (10/25/2021)   Exercise Vital Sign    Days of Exercise per Week: 4 days    Minutes of Exercise per Session: 90 min  Stress: No Stress Concern Present (10/25/2021)   [employer] of Occupational Health - Occupational Stress Questionnaire    Feeling of Stress : Not at all  Social Connections: Moderately Integrated (10/25/2021)   Social Connection and Isolation Panel [NHANES]    Frequency of Communication with Friends and Family: More than three times a week    Frequency of Social Gatherings with Friends and Family: Once a week    Attends Religious Services: Never    Database administrator or Organizations: Yes    Attends Engineer, structural: More than 4 times per year    Marital Status: Married    Activities of Daily Living    10/25/2021    3:16 PM  In your present state of health, do you have any difficulty performing the following activities:  Hearing? 1  Comment some hearing loss (bilateral)  Vision? 0  Difficulty concentrating or making decisions? 0  Walking or climbing stairs? 0  Dressing or bathing? 0  Doing errands, shopping? 0  Preparing Food and eating ? N  Using the Toilet? N  In the past six months, have you accidently leaked urine? N  Do you have problems with loss of bowel control? N  Managing your Medications? N  Managing your Finances? N  Housekeeping or managing your Housekeeping? N    Patient Education/Literacy How often do you need to have someone help you when you read instructions, pamphlets, or other written materials from your doctor or pharmacy?: 1 - Never What is the last grade level you completed in school?: Bachelor's degree  Exercise Current Exercise Habits: Home exercise routine, Type  of exercise: walking;Other - see comments (pickle ball, golf), Time (Minutes): > 60, Frequency (Times/Week): 4, Weekly Exercise (Minutes/Week): 0, Intensity: Moderate, Exercise limited by: None identified  Diet Patient reports consuming 3 meals a day and 1 snack(s) a day Patient reports that her primary diet is: Regular Patient reports that she does have regular access to food.   Depression Screen    10/25/2021    3:11 PM 11/16/2020    2:31 PM 10/23/2020    1:11 PM 11/14/2018    9:19 AM 08/24/2017    9:42 AM 08/06/2015  9:10 AM 06/01/2015    3:34 PM  PHQ 2/9 Scores  PHQ - 2 Score 0 0 0 0 0 0 0     Fall Risk    10/25/2021    3:11 PM 11/16/2020    2:31 PM 10/23/2020    1:11 PM 11/14/2018    9:18 AM 08/24/2017    9:42 AM  [place] in the past year? 0 0 0 0 No  Number falls in past yr: 0 0 0 0   Injury with Fall? 0 0 0 0   Risk for fall due to : No Fall Risks No Fall Risks No Fall Risks    Follow up Falls evaluation completed Falls prevention discussed;Falls evaluation completed Falls evaluation completed       Objective:   BP 111/62 (BP Location: Right Arm, Patient Position: Sitting, Cuff Size: Normal)   Pulse 71   Ht 5' 7.5" (1.715 m)   Wt 197 lb 9.6 oz (89.6 kg)   SpO2 99%   BMI 30.49 kg/m   Last Weight  Most recent update: 10/25/2021  3:00 PM    Weight  89.6 kg (197 lb 9.6 oz)             Body mass index is 30.49 kg/m.  Hearing/Vision  Pat did not have difficulty with hearing/understanding during the face-to-face interview Averyana did not have difficulty with her vision during the face-to-face interview Reports that she has had a formal eye exam by an eye care professional within the past year Reports that she has not had a formal hearing evaluation within the past year  Cognitive Function:    10/25/2021    3:18 PM 10/23/2020    1:23 PM 08/24/2017    9:41 AM 08/23/2016    9:15 AM  6CIT Screen  What Year? 0 points 0 points 0 points 0 points  What month?  0 points 0 points 0 points 0 points  What time? 0 points 0 points 0 points 0 points  Count back from 20 0 points 0 points 0 points 0 points  Months in reverse 0 points 0 points 0 points 0 points  Repeat phrase 0 points 0 points 2 points 2 points  Total Score 0 points 0 points 2 points 2 points    Normal Cognitive Function Screening: Yes (Normal:0-7, Significant for Dysfunction: >8)  Immunization & Health Maintenance Record Immunization History  Administered Date(s) Administered   Fluad Quad(high Dose 65+) 11/14/2018, 11/15/2019, 11/16/2020   Influenza, High Dose Seasonal PF 12/25/2013, 11/30/2017   Influenza,inj,Quad PF,6+ Mos 11/06/2014   Influenza,inj,quad, With Preservative 11/29/2016   PFIZER(Purple Top)SARS-COV-2 Vaccination 03/20/2019, 04/16/2019, 12/01/2019   Pneumococcal Conjugate-13 08/06/2015   Pneumococcal Polysaccharide-23 08/23/2016   Tdap 04/03/2007, 11/06/2014   Zoster, Live 10/11/2010    Health Maintenance  Topic Date Due   Zoster Vaccines- Shingrix (1 of 2) 01/25/2022 (Originally 12/21/1999)   INFLUENZA VACCINE  05/29/2022 (Originally 09/28/2021)   COVID-19 Vaccine (4 - Pfizer series) 12/03/2022 (Originally 01/26/2020)   MAMMOGRAM  10/03/2022   COLONOSCOPY (Pts 45-47yrs Insurance coverage will need to be confirmed)  02/29/2024   TETANUS/TDAP  11/05/2024   Pneumonia Vaccine 12+ Years old  Completed   DEXA SCAN  Completed   Hepatitis C Screening  Completed   HPV VACCINES  Aged Out       Assessment  This is a routine wellness examination for [employer].  Health Maintenance: Due or Overdue There are no preventive care reminders to display for  this patient.   Kanoe Brickhouse does not need a referral for Community Assistance: Care Management:   no Social Work:    no Prescription Assistance:  no Nutrition/Diabetes Education:  no   Plan:  Personalized Goals  Goals Addressed               This Visit's Progress     Patient Stated (pt-stated)         Would like to loose 20 lbs.       Personalized Health Maintenance & Screening Recommendations  Influenza vaccine Screening mammography Shingrix vaccine  She is scheduled for her mammogram.  Lung Cancer Screening Recommended: no (Low Dose CT Chest recommended if Age 83-80 years, 30 pack-year currently smoking OR have quit w/in past 15 years) Hepatitis C Screening recommended: no HIV Screening recommended: no  Advanced Directives: Written information was not given per the patient's request.  Referrals & Orders No orders of the defined types were placed in this encounter.   Follow-up Plan Follow-up with Hali Marry, MD as planned Schedule your Shingrix vaccine.  Medicare wellness visit in one year.  AVS printed and given to the patient.   I have personally reviewed and noted the following in the patient's chart:   Medical and social history Use of alcohol, tobacco or illicit drugs  Current medications and supplements Functional ability and status Nutritional status Physical activity Advanced directives List of other physicians Hospitalizations, surgeries, and ER visits in previous 12 months Vitals Screenings to include cognitive, depression, and falls Referrals and appointments  In addition, I have reviewed and discussed with patient certain preventive protocols, quality metrics, and best practice recommendations. A written personalized care plan for preventive services as well as general preventive health recommendations were provided to patient.     Tinnie Gens  10/25/2021

## 2021-10-25 NOTE — Telephone Encounter (Signed)
Pt informed.  Pt expressed understanding.  T. Prateek Knipple, CMA 

## 2021-10-25 NOTE — Telephone Encounter (Signed)
Leaving to go out of the country and wanted to know if she should take the 3rd booster shot or wait for the next covid shot that's suppose to come out in a few months. Patient will be leaving the country in about 2 weeks and would like a reply before please. Tvt

## 2021-10-25 NOTE — Patient Instructions (Addendum)
MEDICARE ANNUAL WELLNESS VISIT Health Maintenance Summary and Written Plan of Care  Alexis Fisher ,  Thank you for allowing me to perform your Medicare Annual Wellness Visit and for your ongoing commitment to your health.   Health Maintenance & Immunization History Health Maintenance  Topic Date Due   Zoster Vaccines- Shingrix (1 of 2) 01/25/2022 (Originally 12/21/1999)   INFLUENZA VACCINE  05/29/2022 (Originally 09/28/2021)   COVID-19 Vaccine (4 - Pfizer series) 12/03/2022 (Originally 01/26/2020)   MAMMOGRAM  10/03/2022   COLONOSCOPY (Pts 45-32yrs Insurance coverage will need to be confirmed)  02/29/2024   TETANUS/TDAP  11/05/2024   Pneumonia Vaccine 96+ Years old  Completed   DEXA SCAN  Completed   Hepatitis C Screening  Completed   HPV VACCINES  Aged Out   Immunization History  Administered Date(s) Administered   Fluad Quad(high Dose 65+) 11/14/2018, 11/15/2019, 11/16/2020   Influenza, High Dose Seasonal PF 12/25/2013, 11/30/2017   Influenza,inj,Quad PF,6+ Mos 11/06/2014   Influenza,inj,quad, With Preservative 11/29/2016   PFIZER(Purple Top)SARS-COV-2 Vaccination 03/20/2019, 04/16/2019, 12/01/2019   Pneumococcal Conjugate-13 08/06/2015   Pneumococcal Polysaccharide-23 08/23/2016   Tdap 04/03/2007, 11/06/2014   Zoster, Live 10/11/2010    These are the patient goals that we discussed:  Goals Addressed               This Visit's Progress     Patient Stated (pt-stated)        Would like to loose 20 lbs.         This is a list of Health Maintenance Items that are overdue or due now: Influenza vaccine Screening mammography Shingrix vaccine  Orders/Referrals Placed Today: No orders of the defined types were placed in this encounter.  (Contact our referral department at [phone] if you have not spoken with someone about your referral appointment within the next 5 days)    Follow-up Plan Follow-up with Agapito Games, MD as planned Schedule your  shingrix vaccine.  Medicare wellness visit in one year.  AVS printed and given to the patient.      Health Maintenance, Female Adopting a healthy lifestyle and getting preventive care are important in promoting health and wellness. Ask your health care provider about: The right schedule for you to have regular tests and exams. Things you can do on your own to prevent diseases and keep yourself healthy. What should I know about diet, weight, and exercise? Eat a healthy diet  Eat a diet that includes plenty of vegetables, fruits, low-fat dairy products, and lean protein. Do not eat a lot of foods that are high in solid fats, added sugars, or sodium. Maintain a healthy weight Body mass index (BMI) is used to identify weight problems. It estimates body fat based on height and weight. Your health care provider can help determine your BMI and help you achieve or maintain a healthy weight. Get regular exercise Get regular exercise. This is one of the most important things you can do for your health. Most adults should: Exercise for at least 150 minutes each week. The exercise should increase your heart rate and make you sweat (moderate-intensity exercise). Do strengthening exercises at least twice a week. This is in addition to the moderate-intensity exercise. Spend less time sitting. Even light physical activity can be beneficial. Watch cholesterol and blood lipids Have your blood tested for lipids and cholesterol at 72 years of age, then have this test every 5 years. Have your cholesterol levels checked more often if: Your lipid or cholesterol levels are  high. You are older than 72 years of age. You are at high risk for heart disease. What should I know about cancer screening? Depending on your health history and family history, you may need to have cancer screening at various ages. This may include screening for: Breast cancer. Cervical cancer. Colorectal cancer. Skin cancer. Lung  cancer. What should I know about heart disease, diabetes, and high blood pressure? Blood pressure and heart disease High blood pressure causes heart disease and increases the risk of stroke. This is more likely to develop in people who have high blood pressure readings or are overweight. Have your blood pressure checked: Every 3-5 years if you are 39-55 years of age. Every year if you are 19 years old or older. Diabetes Have regular diabetes screenings. This checks your fasting blood sugar level. Have the screening done: Once every three years after age 29 if you are at a normal weight and have a low risk for diabetes. More often and at a younger age if you are overweight or have a high risk for diabetes. What should I know about preventing infection? Hepatitis B If you have a higher risk for hepatitis B, you should be screened for this virus. Talk with your health care provider to find out if you are at risk for hepatitis B infection. Hepatitis C Testing is recommended for: Everyone born from [birth year] through 1966. Anyone with known risk factors for hepatitis C. Sexually transmitted infections (STIs) Get screened for STIs, including gonorrhea and chlamydia, if: You are sexually active and are younger than 72 years of age. You are older than 72 years of age and your health care provider tells you that you are at risk for this type of infection. Your sexual activity has changed since you were last screened, and you are at increased risk for chlamydia or gonorrhea. Ask your health care provider if you are at risk. Ask your health care provider about whether you are at high risk for HIV. Your health care provider may recommend a prescription medicine to help prevent HIV infection. If you choose to take medicine to prevent HIV, you should first get tested for HIV. You should then be tested every 3 months for as long as you are taking the medicine. Pregnancy If you are about to stop having your  period (premenopausal) and you may become pregnant, seek counseling before you get pregnant. Take 400 to 800 micrograms (mcg) of folic acid every day if you become pregnant. Ask for birth control (contraception) if you want to prevent pregnancy. Osteoporosis and menopause Osteoporosis is a disease in which the bones lose minerals and strength with aging. This can result in bone fractures. If you are 21 years old or older, or if you are at risk for osteoporosis and fractures, ask your health care provider if you should: Be screened for bone loss. Take a calcium or vitamin D supplement to lower your risk of fractures. Be given hormone replacement therapy (HRT) to treat symptoms of menopause. Follow these instructions at home: Alcohol use Do not drink alcohol if: Your health care provider tells you not to drink. You are pregnant, may be pregnant, or are planning to become pregnant. If you drink alcohol: Limit how much you have to: 0-1 drink a day. Know how much alcohol is in your drink. In the [place] one drink equals one 12 oz bottle of beer (355 mL), one 5 oz glass of wine (148 mL), or one 1 oz glass of hard liquor (  44 mL). Lifestyle Do not use any products that contain nicotine or tobacco. These products include cigarettes, chewing tobacco, and vaping devices, such as e-cigarettes. If you need help quitting, ask your health care provider. Do not use street drugs. Do not share needles. Ask your health care provider for help if you need support or information about quitting drugs. General instructions Schedule regular health, dental, and eye exams. Stay current with your vaccines. Tell your health care provider if: You often feel depressed. You have ever been abused or do not feel safe at home. Summary Adopting a healthy lifestyle and getting preventive care are important in promoting health and wellness. Follow your health care provider's instructions about healthy diet, exercising, and  getting tested or screened for diseases. Follow your health care provider's instructions on monitoring your cholesterol and blood pressure. This information is not intended to replace advice given to you by your health care provider. Make sure you discuss any questions you have with your health care provider. Document Revised: 07/06/2020 Document Reviewed: 07/06/2020 Elsevier Patient Education  2023 [employer].

## 2021-11-02 ENCOUNTER — Ambulatory Visit: Payer: Medicare PPO | Admitting: Sports Medicine

## 2021-11-02 DIAGNOSIS — M7061 Trochanteric bursitis, right hip: Secondary | ICD-10-CM

## 2021-11-02 NOTE — Assessment & Plan Note (Signed)
Pleasant 71 year old female, diagnosed with trochanteric bursitis couple of visits ago, she had some hip abduction weakness, due to her severe pain we injected her trochanteric bursa at the last visit and added some side leg raises which she has done religiously, her strength is improved dramatically, I am unable to push her leg down, all of her trochanteric bursa pain is resolved, she does report some pain near her SI joint that I think is a secondary process, adding a lumbar spine and SI joint conditioning, return to see me as needed, she does have her [place] trip coming up in about 9 days.

## 2021-11-02 NOTE — Progress Notes (Signed)
    Procedures performed today:    None.  Independent interpretation of notes and tests performed by another provider:   None.  Brief History, Exam, Impression, and Recommendations:    Trochanteric bursitis, right hip Pleasant 71 year old female, diagnosed with trochanteric bursitis couple of visits ago, she had some hip abduction weakness, due to her severe pain we injected her trochanteric bursa at the last visit and added some side leg raises which she has done religiously, her strength is improved dramatically, I am unable to push her leg down, all of her trochanteric bursa pain is resolved, she does report some pain near her SI joint that I think is a secondary process, adding a lumbar spine and SI joint conditioning, return to see me as needed, she does have her [place] trip coming up in about 9 days.    ____________________________________________ [name]. Benjamin Stain, M.D., ABFM., CAQSM., AME. Primary Care and Sports Medicine [facility]  Adjunct Professor of Family Medicine  [employer]  Restaurant manager, fast food

## 2021-11-04 DIAGNOSIS — Z1231 Encounter for screening mammogram for malignant neoplasm of breast: Secondary | ICD-10-CM | POA: Diagnosis not present

## 2021-11-04 LAB — HM MAMMOGRAPHY

## 2021-11-13 ENCOUNTER — Encounter: Payer: Self-pay | Admitting: Family Medicine

## 2021-12-21 DIAGNOSIS — Z08 Encounter for follow-up examination after completed treatment for malignant neoplasm: Secondary | ICD-10-CM | POA: Diagnosis not present

## 2021-12-21 DIAGNOSIS — L578 Other skin changes due to chronic exposure to nonionizing radiation: Secondary | ICD-10-CM | POA: Diagnosis not present

## 2021-12-21 DIAGNOSIS — L565 Disseminated superficial actinic porokeratosis (DSAP): Secondary | ICD-10-CM | POA: Diagnosis not present

## 2021-12-21 DIAGNOSIS — L821 Other seborrheic keratosis: Secondary | ICD-10-CM | POA: Diagnosis not present

## 2021-12-21 DIAGNOSIS — L570 Actinic keratosis: Secondary | ICD-10-CM | POA: Diagnosis not present

## 2021-12-21 DIAGNOSIS — Z85828 Personal history of other malignant neoplasm of skin: Secondary | ICD-10-CM | POA: Diagnosis not present

## 2021-12-21 DIAGNOSIS — L814 Other melanin hyperpigmentation: Secondary | ICD-10-CM | POA: Diagnosis not present

## 2021-12-21 DIAGNOSIS — X32XXXA Exposure to sunlight, initial encounter: Secondary | ICD-10-CM | POA: Diagnosis not present

## 2021-12-21 DIAGNOSIS — D0471 Carcinoma in situ of skin of right lower limb, including hip: Secondary | ICD-10-CM | POA: Diagnosis not present

## 2021-12-21 DIAGNOSIS — D485 Neoplasm of uncertain behavior of skin: Secondary | ICD-10-CM | POA: Diagnosis not present

## 2021-12-21 DIAGNOSIS — D225 Melanocytic nevi of trunk: Secondary | ICD-10-CM | POA: Diagnosis not present

## 2021-12-29 DIAGNOSIS — D0471 Carcinoma in situ of skin of right lower limb, including hip: Secondary | ICD-10-CM | POA: Diagnosis not present

## 2021-12-29 DIAGNOSIS — L089 Local infection of the skin and subcutaneous tissue, unspecified: Secondary | ICD-10-CM | POA: Diagnosis not present

## 2021-12-30 ENCOUNTER — Other Ambulatory Visit: Payer: Self-pay | Admitting: Family Medicine

## 2021-12-30 DIAGNOSIS — E7800 Pure hypercholesterolemia, unspecified: Secondary | ICD-10-CM

## 2022-01-11 DIAGNOSIS — L089 Local infection of the skin and subcutaneous tissue, unspecified: Secondary | ICD-10-CM | POA: Diagnosis not present

## 2022-02-24 DIAGNOSIS — S61210A Laceration without foreign body of right index finger without damage to nail, initial encounter: Secondary | ICD-10-CM | POA: Diagnosis not present

## 2022-04-04 ENCOUNTER — Ambulatory Visit: Payer: Medicare PPO | Admitting: Sports Medicine

## 2022-04-04 DIAGNOSIS — M48061 Spinal stenosis, lumbar region without neurogenic claudication: Secondary | ICD-10-CM | POA: Diagnosis not present

## 2022-04-04 DIAGNOSIS — M7061 Trochanteric bursitis, right hip: Secondary | ICD-10-CM | POA: Diagnosis not present

## 2022-04-04 MED ORDER — PREDNISONE 50 MG PO TABS: ORAL_TABLET | ORAL | 0 refills | Status: DC

## 2022-04-04 MED ORDER — TRAMADOL HCL 50 MG PO TABS: 50.0000 mg | ORAL_TABLET | Freq: Three times a day (TID) | ORAL | 0 refills | Status: DC | PRN

## 2022-04-04 NOTE — Progress Notes (Signed)
    Procedures performed today:    None.  Independent interpretation of notes and tests performed by another provider:   None.  Brief History, Exam, Impression, and Recommendations:    Lumbar spinal stenosis This is a pleasant 72 year old female, she has had increasing pain right-sided low back with radiation around the anterior right lower leg. Worse with weightbearing. Occasional numbness and tingling. She did have to spend a few nights in a hospital room on uncomfortable bed when her husband was a did. On exam she does not have any discrete areas of tenderness to palpation, specifically no tenderness over the greater trochanter, low back, no pain with internal rotation of the hip, she does have some reproduction of pain with resisted flexion of the hip with the knee extended. Symptoms are also worse with weightbearing. Suspect lumbar spinal stenosis, hip flexor injury is also in the differential. Adding MRI of the lumbar spine, right hip as she has had greater than 6 weeks of conservative treatment without sufficient improvement. Adding 5 days of prednisone, tramadol.  For insurance coverage purposes of her hip and lumbar spine MRI he has failed greater than 6 weeks of conservative treatment, physician directed, she has failed injections, she does have x-rays that were unrevealing.  Chronic process with exacerbation and pharmacologic intervention  ____________________________________________ Gwen Her. Dianah Field, M.D., ABFM., CAQSM., AME. Primary Care and Sports Medicine [facility]  Adjunct Professor of [place]  Risk manager

## 2022-04-04 NOTE — Assessment & Plan Note (Signed)
This is a pleasant 72 year old female, she has had increasing pain right-sided low back with radiation around the anterior right lower leg. Worse with weightbearing. Occasional numbness and tingling. She did have to spend a few nights in a hospital room on uncomfortable bed when her husband was a did. On exam she does not have any discrete areas of tenderness to palpation, specifically no tenderness over the greater trochanter, low back, no pain with internal rotation of the hip, she does have some reproduction of pain with resisted flexion of the hip with the knee extended. Symptoms are also worse with weightbearing. Suspect lumbar spinal stenosis, hip flexor injury is also in the differential. Adding MRI of the lumbar spine, right hip as she has had greater than 6 weeks of conservative treatment without sufficient improvement. Adding 5 days of prednisone, tramadol.  For insurance coverage purposes of her hip and lumbar spine MRI he has failed greater than 6 weeks of conservative treatment, physician directed, she has failed injections, she does have x-rays that were unrevealing.

## 2022-06-02 ENCOUNTER — Ambulatory Visit: Payer: Medicare PPO | Admitting: Family Medicine

## 2022-06-02 ENCOUNTER — Encounter: Payer: Self-pay | Admitting: Family Medicine

## 2022-06-02 VITALS — BP 115/88 | HR 82 | Temp 101.3°F | Resp 18 | Ht 67.5 in | Wt 187.0 lb

## 2022-06-02 DIAGNOSIS — J329 Chronic sinusitis, unspecified: Secondary | ICD-10-CM | POA: Diagnosis not present

## 2022-06-02 DIAGNOSIS — R059 Cough, unspecified: Secondary | ICD-10-CM | POA: Diagnosis not present

## 2022-06-02 DIAGNOSIS — J40 Bronchitis, not specified as acute or chronic: Secondary | ICD-10-CM

## 2022-06-02 LAB — POCT INFLUENZA A/B
Influenza A, POC: NEGATIVE
Influenza B, POC: NEGATIVE

## 2022-06-02 LAB — POC COVID19 BINAXNOW: SARS Coronavirus 2 Ag: NEGATIVE

## 2022-06-02 MED ORDER — AZITHROMYCIN 250 MG PO TABS: ORAL_TABLET | ORAL | 0 refills | Status: AC

## 2022-06-02 NOTE — Progress Notes (Signed)
Acute Office Visit  Subjective:     Patient ID: Alexis Fisher, female    DOB: January 26, 1951, 72 y.o.   MRN: [id number]  Chief Complaint  Patient presents with   Fever    Patient is here today with complaints of having a fever and a bad cough, she states her symptoms started on Monday with cold chills, body aches, coughing, and sore throat.    HPI Patient is in today for cough fever and sore throat headache for 4 days.  She is also been running a fever it started out more low-grade now she is running 101 today.  She is also had some mild nasal congestion.  Her husband just had open heart surgery so she is very worried about giving him something. Very fatigue and dec appetitie.   ROS      Objective:    BP 115/88   Pulse 82   Temp (!) 101.3 F (38.5 C) (Oral)   Resp 18   Ht 5' 7.5" (1.715 m)   Wt 187 lb (84.8 kg)   SpO2 97%   BMI 28.86 kg/m    Physical Exam Constitutional:      Appearance: She is well-developed.  HENT:     Head: Normocephalic and atraumatic.     Right Ear: External ear normal.     Left Ear: External ear normal.     Nose: Nose normal.  Eyes:     Conjunctiva/sclera: Conjunctivae normal.     Pupils: Pupils are equal, round, and reactive to light.  Neck:     Thyroid: No thyromegaly.  Cardiovascular:     Rate and Rhythm: Normal rate and regular rhythm.     Heart sounds: Normal heart sounds.  Pulmonary:     Effort: Pulmonary effort is normal.     Breath sounds: Normal breath sounds. No wheezing.  Musculoskeletal:     Cervical back: Neck supple.  Lymphadenopathy:     Cervical: No cervical adenopathy.  Skin:    General: Skin is warm and dry.  Neurological:     Mental Status: She is alert and oriented to person, place, and time.     Results for orders placed or performed in visit on 06/02/22  POC COVID-19  Result Value Ref Range   SARS Coronavirus 2 Ag Negative Negative  POCT Influenza A/B  Result Value Ref Range   Influenza A, POC Negative  Negative   Influenza B, POC Negative Negative        Assessment & Plan:   Problem List Items Addressed This Visit   None Visit Diagnoses     Cough, unspecified type    -  Primary   Relevant Orders   POC COVID-19 (Completed)   POCT Influenza A/B (Completed)   Sinobronchitis       Relevant Medications   azithromycin (ZITHROMAX) 250 MG tablet       Bronchitis-negative for flu and for COVID.  We discussed that it is most likely viral.  But if she is not feeling better over the weekend okay to go ahead and start the azithromycin.  Prescription sent to the pharmacy in case.  Okay to take the Coricidin and the Mucinex DM.  Meds ordered this encounter  Medications   azithromycin (ZITHROMAX) 250 MG tablet    Sig: 2 Ttabs PO on Day 1, then one a day x 4 days.    Dispense:  6 tablet    Refill:  0    No follow-ups on file.  Barnetta Chapel  Madilyn Fireman, MD

## 2022-06-19 IMAGING — DX DG HIP (WITH OR WITHOUT PELVIS) 2-3V*R*
3 series · 3 of 3 positions shown · non-contrast
Comparison: None Available.

CLINICAL DATA: Pain.

EXAM:
DG HIP (WITH OR WITHOUT PELVIS) 2-3V RIGHT

[pelvis ap]
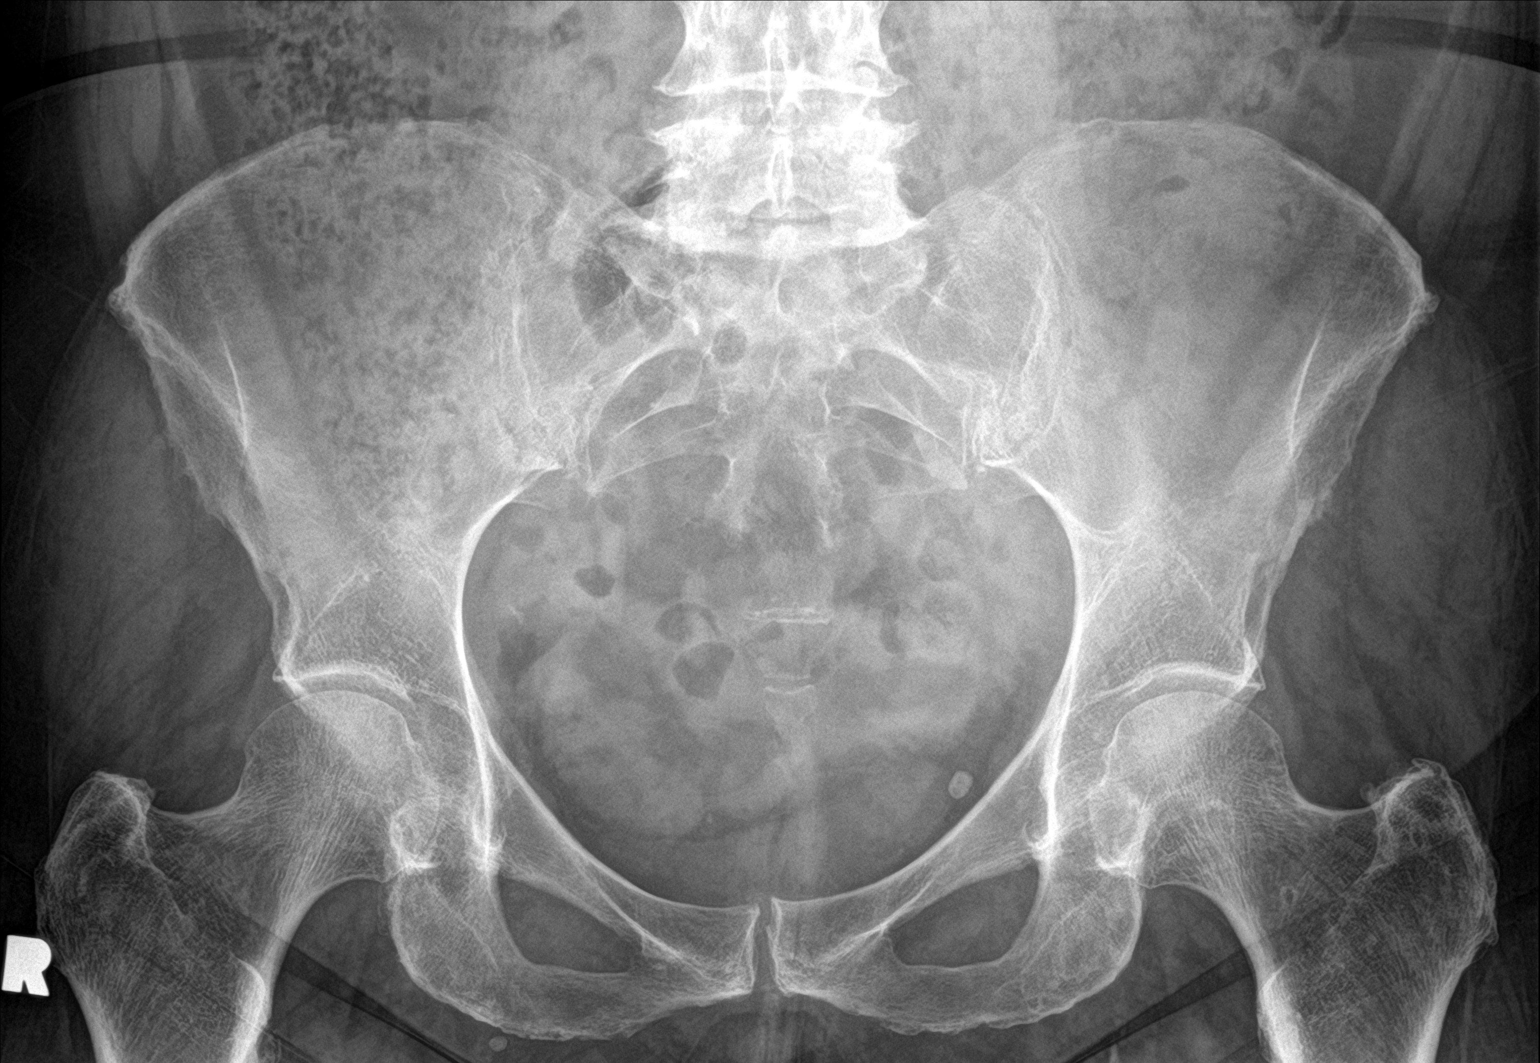

[hip ap]
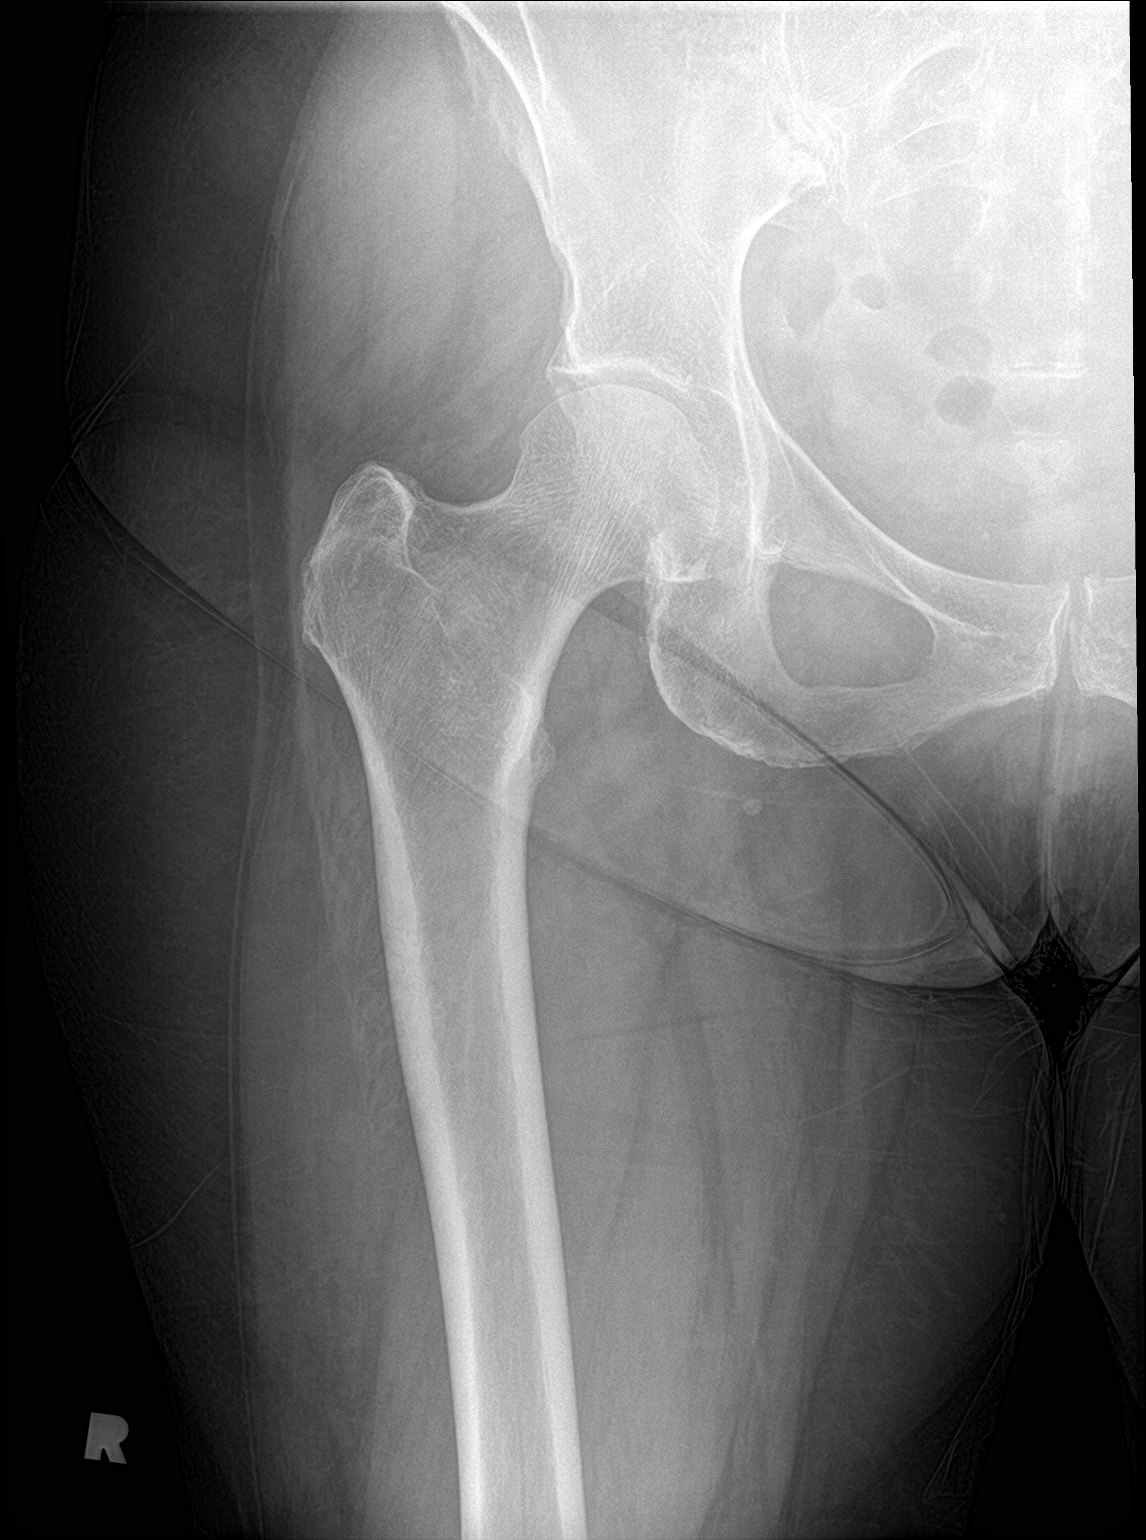

[hip lat]
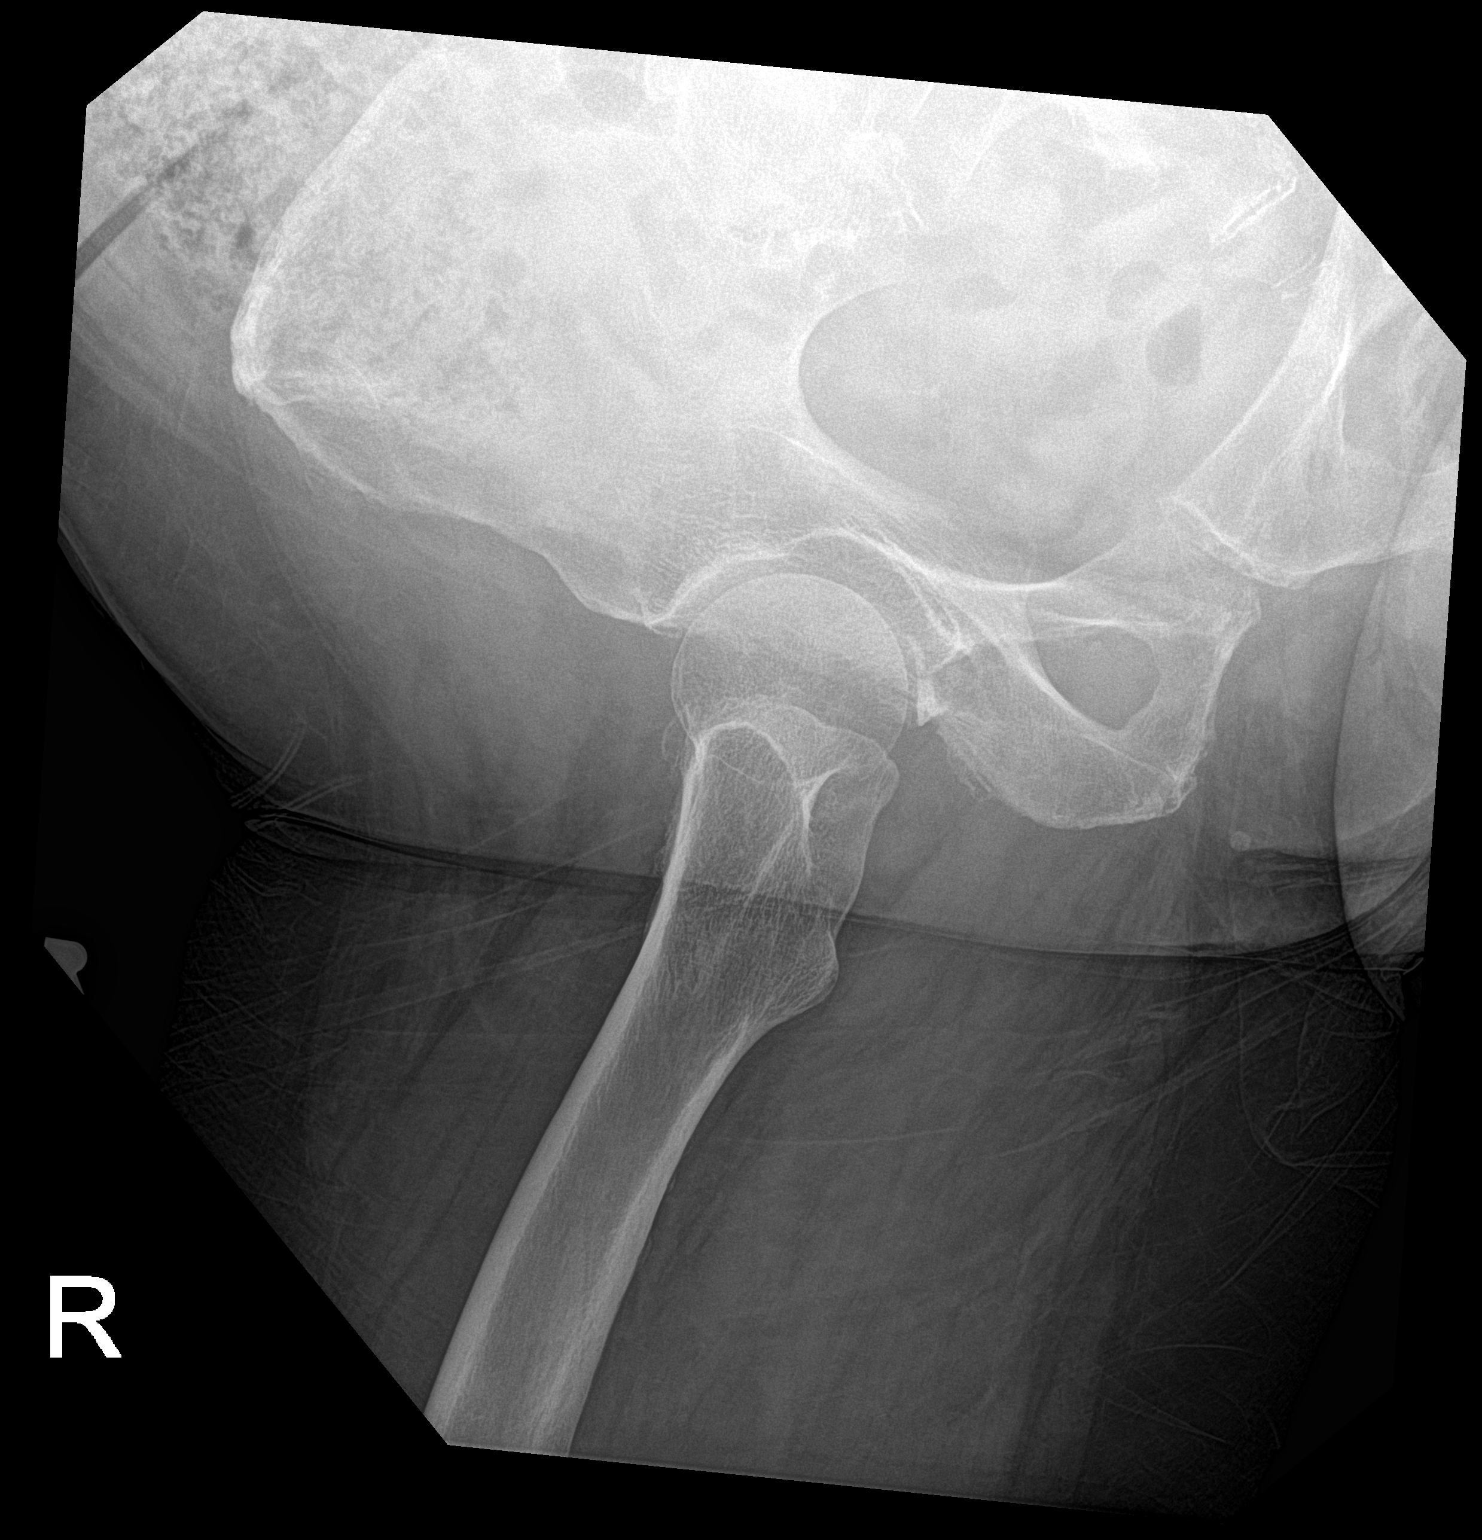

[3 of 3 positions shown; findings below may reference images not displayed]

FINDINGS: Mild degenerative changes without significant loss of joint space.
No fracture or dislocation. No bony lesion. Degenerative changes in
the lower lumbar spine. No other abnormalities.
IMPRESSION: Mild degenerative changes in the right hip without loss of joint
space. Degenerative changes in the lower lumbar spine. No other
abnormalities.

## 2022-10-11 ENCOUNTER — Other Ambulatory Visit: Payer: Self-pay | Admitting: Family Medicine

## 2022-10-11 DIAGNOSIS — E7800 Pure hypercholesterolemia, unspecified: Secondary | ICD-10-CM

## 2022-11-01 ENCOUNTER — Ambulatory Visit (INDEPENDENT_AMBULATORY_CARE_PROVIDER_SITE_OTHER): Payer: Medicare PPO | Admitting: Family Medicine

## 2022-11-01 VITALS — Ht 67.5 in | Wt 186.0 lb

## 2022-11-01 DIAGNOSIS — Z0000 Encounter for general adult medical examination without abnormal findings: Secondary | ICD-10-CM | POA: Diagnosis not present

## 2022-11-01 NOTE — Patient Instructions (Signed)
MEDICARE ANNUAL WELLNESS VISIT Health Maintenance Summary and Written Plan of Care  Ms. Alexis Fisher ,  Thank you for allowing me to perform your Medicare Annual Wellness Visit and for your ongoing commitment to your health.   Health Maintenance & Immunization History Health Maintenance  Topic Date Due   COVID-19 Vaccine (4 - 2023-24 season) 11/17/2022 (Originally 10/30/2022)   Zoster Vaccines- Shingrix (1 of 2) 01/31/2023 (Originally 12/21/1999)   INFLUENZA VACCINE  05/29/2023 (Originally 09/29/2022)   Medicare Annual Wellness (AWV)  11/01/2023   MAMMOGRAM  11/05/2023   Colonoscopy  02/29/2024   DEXA SCAN  08/04/2024   DTaP/Tdap/Td (3 - Td or Tdap) 11/05/2024   Pneumonia Vaccine 33+ Years old  Completed   Hepatitis C Screening  Completed   HPV VACCINES  Aged Out   Immunization History  Administered Date(s) Administered   Fluad Quad(high Dose 65+) 11/14/2018, 11/15/2019, 11/16/2020   Influenza, High Dose Seasonal PF 12/25/2013, 11/30/2017   Influenza,inj,Quad PF,6+ Mos 11/06/2014   Influenza,inj,quad, With Preservative 11/29/2016   PFIZER(Purple Top)SARS-COV-2 Vaccination 03/20/2019, 04/16/2019, 12/01/2019   Pneumococcal Conjugate-13 08/06/2015   Pneumococcal Polysaccharide-23 08/23/2016   Tdap 04/03/2007, 11/06/2014   Zoster, Live 10/11/2010    These are the patient goals that we discussed:  Goals Addressed               This Visit's Progress     Patient Stated (pt-stated)        Patient stated that he would like to loose 10 lbs.         This is a list of Health Maintenance Items that are overdue or due now: Influenza vaccine Screening mammography Shingles vaccine   Scheduled for mammogram on 11/07/22.    Orders/Referrals Placed Today: No orders of the defined types were placed in this encounter.  (Contact our referral department at [phone] if you have not spoken with someone about your referral appointment within the next 5 days)    Follow-up  Plan Follow-up with Agapito Games, MD as planned Schedule shingles vaccine at the pharmacy. It is two doses; after you get the first one, second one is 2-6 months after that. They both have to be completed at the pharmacy due to insurance.  Influenza vaccine can be done at the office or at the pharmacy.  Medicare wellness visit in one year.  Patient will access AVS on my chart.      Health Maintenance, Female Adopting a healthy lifestyle and getting preventive care are important in promoting health and wellness. Ask your health care provider about: The right schedule for you to have regular tests and exams. Things you can do on your own to prevent diseases and keep yourself healthy. What should I know about diet, weight, and exercise? Eat a healthy diet  Eat a diet that includes plenty of vegetables, fruits, low-fat dairy products, and lean protein. Do not eat a lot of foods that are high in solid fats, added sugars, or sodium. Maintain a healthy weight Body mass index (BMI) is used to identify weight problems. It estimates body fat based on height and weight. Your health care provider can help determine your BMI and help you achieve or maintain a healthy weight. Get regular exercise Get regular exercise. This is one of the most important things you can do for your health. Most adults should: Exercise for at least 150 minutes each week. The exercise should increase your heart rate and make you sweat (moderate-intensity exercise). Do strengthening exercises at least twice  a week. This is in addition to the moderate-intensity exercise. Spend less time sitting. Even light physical activity can be beneficial. Watch cholesterol and blood lipids Have your blood tested for lipids and cholesterol at 73 years of age, then have this test every 5 years. Have your cholesterol levels checked more often if: Your lipid or cholesterol levels are high. You are older than 73 years of age. You  are at high risk for heart disease. What should I know about cancer screening? Depending on your health history and family history, you may need to have cancer screening at various ages. This may include screening for: Breast cancer. Cervical cancer. Colorectal cancer. Skin cancer. Lung cancer. What should I know about heart disease, diabetes, and high blood pressure? Blood pressure and heart disease High blood pressure causes heart disease and increases the risk of stroke. This is more likely to develop in people who have high blood pressure readings or are overweight. Have your blood pressure checked: Every 3-5 years if you are 62-60 years of age. Every year if you are 64 years old or older. Diabetes Have regular diabetes screenings. This checks your fasting blood sugar level. Have the screening done: Once every three years after age 30 if you are at a normal weight and have a low risk for diabetes. More often and at a younger age if you are overweight or have a high risk for diabetes. What should I know about preventing infection? Hepatitis B If you have a higher risk for hepatitis B, you should be screened for this virus. Talk with your health care provider to find out if you are at risk for hepatitis B infection. Hepatitis C Testing is recommended for: Everyone born from [birth year] through 1966. Anyone with known risk factors for hepatitis C. Sexually transmitted infections (STIs) Get screened for STIs, including gonorrhea and chlamydia, if: You are sexually active and are younger than 73 years of age. You are older than 73 years of age and your health care provider tells you that you are at risk for this type of infection. Your sexual activity has changed since you were last screened, and you are at increased risk for chlamydia or gonorrhea. Ask your health care provider if you are at risk. Ask your health care provider about whether you are at high risk for HIV. Your health care  provider may recommend a prescription medicine to help prevent HIV infection. If you choose to take medicine to prevent HIV, you should first get tested for HIV. You should then be tested every 3 months for as long as you are taking the medicine. Pregnancy If you are about to stop having your period (premenopausal) and you may become pregnant, seek counseling before you get pregnant. Take 400 to 800 micrograms (mcg) of folic acid every day if you become pregnant. Ask for birth control (contraception) if you want to prevent pregnancy. Osteoporosis and menopause Osteoporosis is a disease in which the bones lose minerals and strength with aging. This can result in bone fractures. If you are 79 years old or older, or if you are at risk for osteoporosis and fractures, ask your health care provider if you should: Be screened for bone loss. Take a calcium or vitamin D supplement to lower your risk of fractures. Be given hormone replacement therapy (HRT) to treat symptoms of menopause. Follow these instructions at home: Alcohol use Do not drink alcohol if: Your health care provider tells you not to drink. You are pregnant, may  be pregnant, or are planning to become pregnant. If you drink alcohol: Limit how much you have to: 0-1 drink a day. Know how much alcohol is in your drink. In the [place] one drink equals one 12 oz bottle of beer (355 mL), one 5 oz glass of wine (148 mL), or one 1 oz glass of hard liquor (44 mL). Lifestyle Do not use any products that contain nicotine or tobacco. These products include cigarettes, chewing tobacco, and vaping devices, such as e-cigarettes. If you need help quitting, ask your health care provider. Do not use street drugs. Do not share needles. Ask your health care provider for help if you need support or information about quitting drugs. General instructions Schedule regular health, dental, and eye exams. Stay current with your vaccines. Tell your health care  provider if: You often feel depressed. You have ever been abused or do not feel safe at home. Summary Adopting a healthy lifestyle and getting preventive care are important in promoting health and wellness. Follow your health care provider's instructions about healthy diet, exercising, and getting tested or screened for diseases. Follow your health care provider's instructions on monitoring your cholesterol and blood pressure. This information is not intended to replace advice given to you by your health care provider. Make sure you discuss any questions you have with your health care provider. Document Revised: 07/06/2020 Document Reviewed: 07/06/2020 Elsevier Patient Education  2024 [employer].

## 2022-11-01 NOTE — Progress Notes (Signed)
MEDICARE ANNUAL WELLNESS VISIT  11/01/2022  Telephone Visit Disclaimer This Medicare AWV was conducted by telephone due to national recommendations for restrictions regarding the COVID-19 Pandemic (e.g. social distancing).  I verified, using two identifiers, that I am speaking with Alexis Fisher or their authorized healthcare agent. I discussed the limitations, risks, security, and privacy concerns of performing an evaluation and management service by telephone and the potential availability of an in-person appointment in the future. The patient expressed understanding and agreed to proceed.  Location of Patient: Home Location of Provider (nurse):  In the office.  Subjective:    Alexis Fisher is a 72 y.o. female patient of Metheney, Barbarann Ehlers, MD who had a Medicare Annual Wellness Visit today via telephone. Alexis Fisher is Retired and lives with their spouse. she has 2 children. she reports that she is socially active and does interact with friends/family regularly. she is moderately physically active and enjoys  playing golf, pickle ball, walking and reading.  Patient Care Team: Agapito Games, MD as PCP - General (Family Medicine) Monica Becton, MD as Consulting Physician (Family Medicine)     11/01/2022    8:45 AM 10/25/2021    3:11 PM 08/18/2021   10:12 AM 10/23/2020    1:11 PM 11/14/2018    9:18 AM 08/24/2017    9:42 AM 11/06/2014    9:15 AM  Advanced Directives  Does Patient Have a Medical Advance Directive? Yes Yes Yes Yes No Yes No  Type of Advance Directive Living will Living will Healthcare Power of [place];Living will Living will;Healthcare Power of [employer] Power of [place];Living will   Does patient want to make changes to medical advance directive? No - Patient declined No - Patient declined  No - Patient declined     Copy of Healthcare Power of Attorney in Chart?   No - copy requested No - copy requested  No - copy requested   Would patient like  information on creating a medical advance directive?     No - Patient declined  No - patient declined information    Hospital Utilization Over the Past 12 Months: # of hospitalizations or ER visits: 0 # of surgeries: 0  Review of Systems    Patient reports that her overall health is unchanged compared to last year.  History obtained from chart review and the patient  Patient Reported Readings (BP, Pulse, CBG, Weight, etc) Weight: 186 lbs Height: 16f7.5  Per patient no change in vitals since last visit, unable to obtain new vitals due to telehealth visit BP: unable to obtain Pulse: unable to obtain  Pain Assessment Pain : No/denies pain     Current Medications & Allergies (verified) Allergies as of 11/01/2022   No Known Allergies      Medication List        Accurate as of November 01, 2022  9:08 AM. If you have any questions, ask your nurse or doctor.          STOP taking these medications    clobetasol ointment 0.05 % Commonly known as: TEMOVATE   predniSONE 50 MG tablet Commonly known as: DELTASONE       TAKE these medications    atorvastatin 40 MG tablet Commonly known as: LIPITOR TAKE 1 TABLET BY MOUTH DAILY AT 6PM   cholecalciferol 1000 units tablet Commonly known as: VITAMIN D Take 1,000 Units by mouth daily.   traMADol 50 MG tablet Commonly known as: ULTRAM Take 1 tablet (50 mg  total) by mouth every 8 (eight) hours as needed for moderate pain.        History (reviewed): History reviewed. No pertinent past medical history. Past Surgical History:  Procedure Laterality Date   wrsit surgery, plate Left 03/6107   Dr. Dewaine Oats   Family History  Problem Relation Age of Onset   Kidney failure Father    Social History   Socioeconomic History   Marital status: Married    Spouse name: Alexis Fisher   Number of children: 2   Years of education: 16   Highest education level: Bachelor's degree (e.g., BA, AB, BS)  Occupational History    Occupation: retired    Comment: Works part-time as a Lawyer.  Tobacco Use   Smoking status: Former    Current packs/day: 0.00    Types: Cigarettes    Quit date: 02/28/1998    Years since quitting: 24.6   Smokeless tobacco: Never  Vaping Use   Vaping status: Never Used  Substance and Sexual Activity   Alcohol use: Yes    Alcohol/week: 3.0 - 4.0 standard drinks of alcohol    Types: 3 - 4 Cans of beer per week    Comment: wine and beer   Drug use: No   Sexual activity: Not Currently    Partners: Male  Other Topics Concern   Not on file  Social History Narrative   Lives with her husband. She enjoys playing golf, pickle ball, walking and reading.   Social Determinants of Health   Financial Resource Strain: Low Risk  (11/01/2022)   Overall Financial Resource Strain (CARDIA)    Difficulty of Paying Living Expenses: Not hard at all  Food Insecurity: No Food Insecurity (11/01/2022)   Hunger Vital Sign    Worried About Running Out of Food in the Last Year: Never true    Ran Out of Food in the Last Year: Never true  Transportation Needs: No Transportation Needs (11/01/2022)   PRAPARE - Administrator, Civil Service (Medical): No    Lack of Transportation (Non-Medical): No  Physical Activity: Sufficiently Active (11/01/2022)   Exercise Vital Sign    Days of Exercise per Week: 4 days    Minutes of Exercise per Session: 120 min  Stress: No Stress Concern Present (11/01/2022)   [employer] of Occupational Health - Occupational Stress Questionnaire    Feeling of Stress : Not at all  Social Connections: Moderately Isolated (11/01/2022)   Social Connection and Isolation Panel [NHANES]    Frequency of Communication with Friends and Family: Three times a week    Frequency of Social Gatherings with Friends and Family: Once a week    Attends Religious Services: Never    Database administrator or Organizations: No    Attends Banker Meetings: Never    Marital  Status: Married    Activities of Daily Living    11/01/2022    8:50 AM  In your present state of health, do you have any difficulty performing the following activities:  Hearing? 1  Comment some difficulty hearing  Vision? 0  Difficulty concentrating or making decisions? 0  Walking or climbing stairs? 0  Dressing or bathing? 0  Doing errands, shopping? 0  Preparing Food and eating ? N  Using the Toilet? N  In the past six months, have you accidently leaked urine? N  Do you have problems with loss of bowel control? N  Managing your Medications? N  Managing your Finances? N  Housekeeping or managing your Housekeeping? N    Patient Education/ Literacy How often do you need to have someone help you when you read instructions, pamphlets, or other written materials from your doctor or pharmacy?: 1 - Never What is the last grade level you completed in school?: Bachelor's degree  Exercise    Diet Patient reports consuming 3 meals a day and 0-1 snack(s) a day Patient reports that her primary diet is: Regular Patient reports that she does have regular access to food.   Depression Screen    11/01/2022    8:46 AM 06/02/2022   10:12 AM 10/25/2021    3:11 PM 11/16/2020    2:31 PM 10/23/2020    1:11 PM 11/14/2018    9:19 AM 08/24/2017    9:42 AM  PHQ 2/9 Scores  PHQ - 2 Score 0 0 0 0 0 0 0     Fall Risk    11/01/2022    8:46 AM 06/02/2022   10:12 AM 10/25/2021    3:11 PM 11/16/2020    2:31 PM 10/23/2020    1:11 PM  Fall Risk   Falls in the past year? 0 0 0 0 0  Number falls in past yr: 0 0 0 0 0  Injury with Fall? 0 0 0 0 0  Risk for fall due to : No Fall Risks No Fall Risks No Fall Risks No Fall Risks No Fall Risks  Follow up Falls evaluation completed Falls evaluation completed Falls evaluation completed Falls prevention discussed;Falls evaluation completed Falls evaluation completed     Objective:  Adisyn Kaman seemed alert and oriented and she participated appropriately during  our telephone visit.  Blood Pressure Weight BMI  BP Readings from Last 3 Encounters:  06/02/22 115/88  10/25/21 111/62  06/25/21 131/70   Wt Readings from Last 3 Encounters:  11/01/22 186 lb (84.4 kg)  06/02/22 187 lb (84.8 kg)  10/25/21 197 lb 9.6 oz (89.6 kg)   BMI Readings from Last 1 Encounters:  11/01/22 28.70 kg/m    *Unable to obtain current vital signs, weight, and BMI due to telephone visit type  Hearing/Vision  Yalitza did not seem to have difficulty with hearing/understanding during the telephone conversation Reports that she has had a formal eye exam by an eye care professional within the past year Reports that she has not had a formal hearing evaluation within the past year *Unable to fully assess hearing and vision during telephone visit type  Cognitive Function:    11/01/2022    8:54 AM 10/25/2021    3:18 PM 10/23/2020    1:23 PM 08/24/2017    9:41 AM 08/23/2016    9:15 AM  6CIT Screen  What Year? 0 points 0 points 0 points 0 points 0 points  What month? 0 points 0 points 0 points 0 points 0 points  What time? 0 points 0 points 0 points 0 points 0 points  Count back from 20 0 points 0 points 0 points 0 points 0 points  Months in reverse 0 points 0 points 0 points 0 points 0 points  Repeat phrase 0 points 0 points 0 points 2 points 2 points  Total Score 0 points 0 points 0 points 2 points 2 points   (Normal:0-7, Significant for Dysfunction: >8)  Normal Cognitive Function Screening: Yes   Immunization & Health Maintenance Record Immunization History  Administered Date(s) Administered   Fluad Quad(high Dose 65+) 11/14/2018, 11/15/2019, 11/16/2020   Influenza, High Dose Seasonal PF 12/25/2013,  11/30/2017   Influenza,inj,Quad PF,6+ Mos 11/06/2014   Influenza,inj,quad, With Preservative 11/29/2016   PFIZER(Purple Top)SARS-COV-2 Vaccination 03/20/2019, 04/16/2019, 12/01/2019   Pneumococcal Conjugate-13 08/06/2015   Pneumococcal Polysaccharide-23 08/23/2016    Tdap 04/03/2007, 11/06/2014   Zoster, Live 10/11/2010    Health Maintenance  Topic Date Due   COVID-19 Vaccine (4 - 2023-24 season) 11/17/2022 (Originally 10/30/2022)   Zoster Vaccines- Shingrix (1 of 2) 01/31/2023 (Originally 12/21/1999)   INFLUENZA VACCINE  05/29/2023 (Originally 09/29/2022)   Medicare Annual Wellness (AWV)  11/01/2023   MAMMOGRAM  11/05/2023   Colonoscopy  02/29/2024   DEXA SCAN  08/04/2024   DTaP/Tdap/Td (3 - Td or Tdap) 11/05/2024   Pneumonia Vaccine 46+ Years old  Completed   Hepatitis C Screening  Completed   HPV VACCINES  Aged Out       Assessment  This is a routine wellness examination for [employer].  Health Maintenance: Due or Overdue There are no preventive care reminders to display for this patient.   Mykyla Bosket does not need a referral for Community Assistance: Care Management:   no Social Work:    no Prescription Assistance:  no Nutrition/Diabetes Education:  no   Plan:  Personalized Goals  Goals Addressed               This Visit's Progress     Patient Stated (pt-stated)        Patient stated that he would like to loose 10 lbs.       Personalized Health Maintenance & Screening Recommendations  Influenza vaccine Screening mammography Shingles vaccine    Scheduled for mammogram on 11/07/22.  Lung Cancer Screening Recommended: no (Low Dose CT Chest recommended if Age 30-80 years, 20 pack-year currently smoking OR have quit w/in past 15 years) Hepatitis C Screening recommended: no HIV Screening recommended: no  Advanced Directives: Written information was not prepared per patient's request.  Referrals & Orders No orders of the defined types were placed in this encounter.   Follow-up Plan Follow-up with Agapito Games, MD as planned Schedule shingles vaccine at the pharmacy. It is two doses; after you get the first one, second one is 2-6 months after that. They both have to be completed at the pharmacy due to  insurance.  Influenza vaccine can be done at the office or at the pharmacy.  Medicare wellness visit in one year.  Patient will access AVS on my chart.   I have personally reviewed and noted the following in the patient's chart:   Medical and social history Use of alcohol, tobacco or illicit drugs  Current medications and supplements Functional ability and status Nutritional status Physical activity Advanced directives List of other physicians Hospitalizations, surgeries, and ER visits in previous 12 months Vitals Screenings to include cognitive, depression, and falls Referrals and appointments  In addition, I have reviewed and discussed with Darl Pikes Basquez certain preventive protocols, quality metrics, and best practice recommendations. A written personalized care plan for preventive services as well as general preventive health recommendations is available and can be mailed to the patient at her request.      Modesto Charon, RN BSN  11/01/2022

## 2022-11-07 DIAGNOSIS — Z1231 Encounter for screening mammogram for malignant neoplasm of breast: Secondary | ICD-10-CM | POA: Diagnosis not present

## 2022-11-07 DIAGNOSIS — R92323 Mammographic fibroglandular density, bilateral breasts: Secondary | ICD-10-CM | POA: Diagnosis not present

## 2022-11-07 LAB — HM MAMMOGRAPHY

## 2022-11-17 ENCOUNTER — Encounter: Payer: Self-pay | Admitting: Family Medicine

## 2022-11-28 ENCOUNTER — Telehealth: Payer: Self-pay | Admitting: Family Medicine

## 2022-11-28 DIAGNOSIS — Z13220 Encounter for screening for lipoid disorders: Secondary | ICD-10-CM

## 2022-11-28 DIAGNOSIS — N1831 Chronic kidney disease, stage 3a: Secondary | ICD-10-CM

## 2022-11-28 DIAGNOSIS — M8580 Other specified disorders of bone density and structure, unspecified site: Secondary | ICD-10-CM

## 2022-11-28 NOTE — Telephone Encounter (Signed)
Patient called she is requesting lab work prior to her physical which is scheduled for Monday October 7th

## 2022-11-28 NOTE — Telephone Encounter (Signed)
Orders Placed This Encounter  Procedures   CMP14+EGFR   Lipid panel   CBC   VITAMIN D 25 Hydroxy (Vit-D Deficiency, Fractures)

## 2022-11-29 DIAGNOSIS — M8580 Other specified disorders of bone density and structure, unspecified site: Secondary | ICD-10-CM | POA: Diagnosis not present

## 2022-11-29 DIAGNOSIS — Z13220 Encounter for screening for lipoid disorders: Secondary | ICD-10-CM | POA: Diagnosis not present

## 2022-11-29 DIAGNOSIS — N1831 Chronic kidney disease, stage 3a: Secondary | ICD-10-CM | POA: Diagnosis not present

## 2022-11-30 LAB — LIPID PANEL
Chol/HDL Ratio: 2.8 {ratio} (ref 0.0–4.4)
Cholesterol, Total: 168 mg/dL (ref 100–199)
HDL: 61 mg/dL (ref >39)
LDL Chol Calc (NIH): 94 mg/dL (ref 0–99)
Triglycerides: 68 mg/dL (ref 0–149)
VLDL Cholesterol Cal: 13 mg/dL (ref 5–40)

## 2022-11-30 LAB — CMP14+EGFR
ALT: 13 [IU]/L (ref 0–32)
AST: 18 [IU]/L (ref 0–40)
Albumin: 4.2 g/dL (ref 3.8–4.8)
Alkaline Phosphatase: 56 [IU]/L (ref 44–121)
BUN/Creatinine Ratio: 16 (ref 12–28)
BUN: 17 mg/dL (ref 8–27)
Bilirubin Total: 0.6 mg/dL (ref 0.0–1.2)
CO2: 22 mmol/L (ref 20–29)
Calcium: 9.2 mg/dL (ref 8.7–10.3)
Chloride: 106 mmol/L (ref 96–106)
Creatinine, Ser: 1.06 mg/dL — ABNORMAL HIGH (ref 0.57–1.00)
Globulin, Total: 2.2 g/dL (ref 1.5–4.5)
Glucose: 89 mg/dL (ref 70–99)
Potassium: 4.5 mmol/L (ref 3.5–5.2)
Sodium: 141 mmol/L (ref 134–144)
Total Protein: 6.4 g/dL (ref 6.0–8.5)
eGFR: 56 mL/min/{1.73_m2} — ABNORMAL LOW (ref >59)

## 2022-11-30 LAB — CBC
Hematocrit: 39.8 % (ref 34.0–46.6)
Hemoglobin: 12.6 g/dL (ref 11.1–15.9)
MCH: 28.3 pg (ref 26.6–33.0)
MCHC: 31.7 g/dL (ref 31.5–35.7)
MCV: 89 fL (ref 79–97)
Platelets: 191 10*3/uL (ref 150–450)
RBC: 4.45 x10E6/uL (ref 3.77–5.28)
RDW: 12.4 % (ref 11.7–15.4)
WBC: 2.8 10*3/uL — ABNORMAL LOW (ref 3.4–10.8)

## 2022-11-30 LAB — VITAMIN D 25 HYDROXY (VIT D DEFICIENCY, FRACTURES): Vit D, 25-Hydroxy: 49 ng/mL (ref 30.0–100.0)

## 2022-11-30 NOTE — Progress Notes (Signed)
Hi Stacy, kidney function looks good at 1.0.  Liver enzymes are normal as well.  White blood cell count is just a little bit low but it looks like this is your baseline.  Hemoglobin looks great no sign of anemia.  LDL cholesterol is under 100 which is great.  Vitamin D also looks perfect.

## 2022-12-05 ENCOUNTER — Encounter: Payer: Self-pay | Admitting: Family Medicine

## 2022-12-05 ENCOUNTER — Ambulatory Visit (INDEPENDENT_AMBULATORY_CARE_PROVIDER_SITE_OTHER): Payer: Medicare PPO | Admitting: Family Medicine

## 2022-12-05 VITALS — BP 125/59 | HR 83 | Ht 67.5 in | Wt 188.0 lb

## 2022-12-05 DIAGNOSIS — Z0000 Encounter for general adult medical examination without abnormal findings: Secondary | ICD-10-CM | POA: Diagnosis not present

## 2022-12-05 DIAGNOSIS — R21 Rash and other nonspecific skin eruption: Secondary | ICD-10-CM | POA: Diagnosis not present

## 2022-12-05 MED ORDER — TRIAMCINOLONE ACETONIDE 0.1 % EX CREA: 1.0000 | TOPICAL_CREAM | Freq: Two times a day (BID) | CUTANEOUS | 0 refills | Status: DC

## 2022-12-05 NOTE — Progress Notes (Signed)
Complete physical exam  Patient: Alexis Fisher   DOB: [birth year]   72 y.o. Female  MRN: [id number]  Subjective:    Chief Complaint  Patient presents with   Annual Exam    Eileen Kangas is a 72 y.o. female who presents today for a complete physical exam. She reports consuming a general diet.  Golfing 2 x per week and walks 2 other days.   She generally feels well. She reports sleeping fairly well. She does not have additional problems to discuss today.   Reports rash on left mid buttock cheek. Has been using benadryl cream on it. Just woke up with it yesterday. Wonders if reacting to her soap, etc. .     Most recent fall risk assessment:    11/01/2022    8:46 AM  Fall Risk   Falls in the past year? 0  Number falls in past yr: 0  Injury with Fall? 0  Risk for fall due to : No Fall Risks  Follow up Falls evaluation completed     Most recent depression screenings:    11/01/2022    8:46 AM 06/02/2022   10:12 AM  PHQ 2/9 Scores  PHQ - 2 Score 0 0        Patient Care Team: Agapito Games, MD as PCP - General (Family Medicine) Monica Becton, MD as Consulting Physician (Family Medicine)   Outpatient Medications Prior to Visit  Medication Sig   atorvastatin (LIPITOR) 40 MG tablet TAKE 1 TABLET BY MOUTH DAILY AT 6PM   cholecalciferol (VITAMIN D) 1000 units tablet Take 1,000 Units by mouth daily.   [DISCONTINUED] traMADol (ULTRAM) 50 MG tablet Take 1 tablet (50 mg total) by mouth every 8 (eight) hours as needed for moderate pain. (Patient not taking: Reported on 06/02/2022)   No facility-administered medications prior to visit.    ROS        Objective:     BP (!) 125/59   Pulse 83   Ht 5' 7.5" (1.715 m)   Wt 188 lb (85.3 kg)   SpO2 99%   BMI 29.01 kg/m    Physical Exam Constitutional:      Appearance: Normal appearance.  HENT:     Head: Normocephalic and atraumatic.     Right Ear: Tympanic membrane, ear canal and external ear normal.     Left  Ear: Tympanic membrane, ear canal and external ear normal.     Nose: Nose normal.     Mouth/Throat:     Pharynx: Oropharynx is clear.  Eyes:     Extraocular Movements: Extraocular movements intact.     Conjunctiva/sclera: Conjunctivae normal.     Pupils: Pupils are equal, round, and reactive to light.  Neck:     Thyroid: No thyromegaly.  Cardiovascular:     Rate and Rhythm: Normal rate and regular rhythm.  Pulmonary:     Effort: Pulmonary effort is normal.     Breath sounds: Normal breath sounds.  Abdominal:     General: Bowel sounds are normal.     Palpations: Abdomen is soft.     Tenderness: There is no abdominal tenderness.  Musculoskeletal:        General: No swelling.     Cervical back: Neck supple.  Skin:    General: Skin is warm and dry.  Neurological:     Mental Status: She is oriented to person, place, and time.  Psychiatric:        Mood and Affect: Mood normal.  Behavior: Behavior normal.      No results found for any visits on 12/05/22.     Assessment & Plan:    Routine Health Maintenance and Physical Exam  Immunization History  Administered Date(s) Administered   Fluad Quad(high Dose 65+) 11/14/2018, 11/15/2019, 11/16/2020   Influenza, High Dose Seasonal PF 12/25/2013, 11/30/2017   Influenza,inj,Quad PF,6+ Mos 11/06/2014   Influenza,inj,quad, With Preservative 11/29/2016   PFIZER(Purple Top)SARS-COV-2 Vaccination 03/20/2019, 04/16/2019, 12/01/2019   Pneumococcal Conjugate-13 08/06/2015   Pneumococcal Polysaccharide-23 08/23/2016   Tdap 04/03/2007, 11/06/2014   Zoster, Live 10/11/2010    Health Maintenance  Topic Date Due   COVID-19 Vaccine (4 - 2023-24 season) 10/30/2022   Zoster Vaccines- Shingrix (1 of 2) 01/31/2023 (Originally 12/21/1999)   INFLUENZA VACCINE  05/29/2023 (Originally 09/29/2022)   Medicare Annual Wellness (AWV)  11/01/2023   Colonoscopy  02/29/2024   DEXA SCAN  08/04/2024   DTaP/Tdap/Td (3 - Td or Tdap) 11/05/2024    MAMMOGRAM  11/06/2024   Pneumonia Vaccine 58+ Years old  Completed   Hepatitis C Screening  Completed   HPV VACCINES  Aged Out    Discussed health benefits of physical activity, and encouraged her to engage in regular exercise appropriate for her age and condition.  Problem List Items Addressed This Visit   None Visit Diagnoses     Wellness examination    -  Primary   Rash           Keep up a regular exercise program and make sure you are eating a healthy diet Try to eat 4 servings of dairy a day, or if you are lactose intolerant take a calcium with vitamin D daily.  Your vaccines are up to date.   Rash - will tx with topical steroid. Look similar to eczema.  Pink dry scaling rash approx 2.5 inches and oval shaped on inner left buttock cheek near sacrum   No follow-ups on file.     Nani Gasser, MD

## 2023-02-26 DIAGNOSIS — H903 Sensorineural hearing loss, bilateral: Secondary | ICD-10-CM | POA: Diagnosis not present

## 2023-04-11 DIAGNOSIS — L84 Corns and callosities: Secondary | ICD-10-CM | POA: Diagnosis not present

## 2023-04-11 DIAGNOSIS — L565 Disseminated superficial actinic porokeratosis (DSAP): Secondary | ICD-10-CM | POA: Diagnosis not present

## 2023-04-11 DIAGNOSIS — D225 Melanocytic nevi of trunk: Secondary | ICD-10-CM | POA: Diagnosis not present

## 2023-04-11 DIAGNOSIS — D485 Neoplasm of uncertain behavior of skin: Secondary | ICD-10-CM | POA: Diagnosis not present

## 2023-04-11 DIAGNOSIS — L858 Other specified epidermal thickening: Secondary | ICD-10-CM | POA: Diagnosis not present

## 2023-04-11 DIAGNOSIS — L821 Other seborrheic keratosis: Secondary | ICD-10-CM | POA: Diagnosis not present

## 2023-04-11 DIAGNOSIS — Z08 Encounter for follow-up examination after completed treatment for malignant neoplasm: Secondary | ICD-10-CM | POA: Diagnosis not present

## 2023-04-11 DIAGNOSIS — L570 Actinic keratosis: Secondary | ICD-10-CM | POA: Diagnosis not present

## 2023-04-11 DIAGNOSIS — Z7189 Other specified counseling: Secondary | ICD-10-CM | POA: Diagnosis not present

## 2023-04-11 DIAGNOSIS — X32XXXA Exposure to sunlight, initial encounter: Secondary | ICD-10-CM | POA: Diagnosis not present

## 2023-04-11 DIAGNOSIS — L578 Other skin changes due to chronic exposure to nonionizing radiation: Secondary | ICD-10-CM | POA: Diagnosis not present

## 2023-04-11 DIAGNOSIS — C44311 Basal cell carcinoma of skin of nose: Secondary | ICD-10-CM | POA: Diagnosis not present

## 2023-05-09 DIAGNOSIS — Z1211 Encounter for screening for malignant neoplasm of colon: Secondary | ICD-10-CM | POA: Diagnosis not present

## 2023-05-09 DIAGNOSIS — D122 Benign neoplasm of ascending colon: Secondary | ICD-10-CM | POA: Diagnosis not present

## 2023-05-09 DIAGNOSIS — K635 Polyp of colon: Secondary | ICD-10-CM | POA: Diagnosis not present

## 2023-05-09 DIAGNOSIS — K621 Rectal polyp: Secondary | ICD-10-CM | POA: Diagnosis not present

## 2023-05-22 DIAGNOSIS — C44311 Basal cell carcinoma of skin of nose: Secondary | ICD-10-CM | POA: Diagnosis not present

## 2023-05-22 DIAGNOSIS — S0120XA Unspecified open wound of nose, initial encounter: Secondary | ICD-10-CM | POA: Diagnosis not present

## 2023-06-26 ENCOUNTER — Other Ambulatory Visit (HOSPITAL_COMMUNITY): Payer: Self-pay

## 2023-07-07 ENCOUNTER — Other Ambulatory Visit: Payer: Self-pay | Admitting: Family Medicine

## 2023-07-07 DIAGNOSIS — E7800 Pure hypercholesterolemia, unspecified: Secondary | ICD-10-CM

## 2023-08-09 DIAGNOSIS — M21622 Bunionette of left foot: Secondary | ICD-10-CM | POA: Diagnosis not present

## 2023-08-09 DIAGNOSIS — M2022 Hallux rigidus, left foot: Secondary | ICD-10-CM | POA: Diagnosis not present

## 2023-08-09 DIAGNOSIS — M2012 Hallux valgus (acquired), left foot: Secondary | ICD-10-CM | POA: Diagnosis not present

## 2023-08-09 DIAGNOSIS — M79672 Pain in left foot: Secondary | ICD-10-CM | POA: Diagnosis not present

## 2023-10-02 DIAGNOSIS — L281 Prurigo nodularis: Secondary | ICD-10-CM | POA: Diagnosis not present

## 2023-10-02 DIAGNOSIS — Z48817 Encounter for surgical aftercare following surgery on the skin and subcutaneous tissue: Secondary | ICD-10-CM | POA: Diagnosis not present

## 2023-10-05 ENCOUNTER — Encounter: Payer: Self-pay | Admitting: Family Medicine

## 2023-10-05 ENCOUNTER — Ambulatory Visit: Admitting: Family Medicine

## 2023-10-05 VITALS — BP 123/58 | HR 70 | Ht 67.5 in | Wt 191.1 lb

## 2023-10-05 DIAGNOSIS — Z111 Encounter for screening for respiratory tuberculosis: Secondary | ICD-10-CM | POA: Diagnosis not present

## 2023-10-05 DIAGNOSIS — Z021 Encounter for pre-employment examination: Secondary | ICD-10-CM | POA: Diagnosis not present

## 2023-10-05 NOTE — Progress Notes (Signed)
 Pharmacy  Established Patient Office Visit  Subjective  Patient ID: Alexis Fisher, female    DOB: 1950-08-09  Age: 73 y.o. 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  She did have vaccines done at her local pharmacy and we will try to get those updated in her chart.  She does wear glasses but otherwise vision is not limited when she wears them.  She does wear bilateral hearing aids as well.  She does have a history of spinal stenosis so should not lift more than 10 pounds.  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  No recent chest pain or shortness of breath.     ROS    Objective:     BP (!) 123/58   Pulse 70   Ht 5' 7.5 (1.715 m)   Wt 191 lb 1.9 oz (86.7 kg)   SpO2 100%   BMI 29.49 kg/m    Physical Exam Vitals and nursing note reviewed.  Constitutional:      Appearance: Normal appearance.  HENT:     Head: Normocephalic and atraumatic.  Eyes:     Conjunctiva/sclera: Conjunctivae normal.  Cardiovascular:     Rate and Rhythm: Normal rate and regular rhythm.  Pulmonary:     Effort: Pulmonary effort is normal.     Breath sounds: Normal breath sounds.  Skin:    General: Skin is warm and dry.  Neurological:     Mental Status: She is alert.  Psychiatric:        Mood and Affect: Mood normal.      No results found for any visits on 10/05/23.    The 10-year ASCVD risk score (Arnett DK, et al., 2019) is: 11.7%    Assessment & Plan:   Problem List Items Addressed This Visit   None Visit Diagnoses       Screening-pulmonary TB    -  Primary   Relevant Orders   QuantiFERON-TB Gold Plus     Pre-employment examination          Exam is normal and reassuring.  Will complete form.  Will have a TB test drawn  to be applied first to skin test since she would have to return on the weekend to have that read.  Once we get that back we will have her form completed and we will get everything updated for her.  She is otherwise doing well she plans on coming back in the fall physical.  Will get a copy of those vaccines that she had done at the local pharmacy she is otherwise up-to-date.  Did encourage her to get the shingles vaccine done this fall if she gets a chance.  She has been traveling a lot lately.  Return if symptoms worsen or fail to improve.    Dorothyann Byars, MD

## 2023-10-09 ENCOUNTER — Other Ambulatory Visit: Payer: Self-pay | Admitting: Family Medicine

## 2023-10-09 ENCOUNTER — Ambulatory Visit: Payer: Self-pay

## 2023-10-09 ENCOUNTER — Ambulatory Visit: Payer: Self-pay | Admitting: Family Medicine

## 2023-10-09 DIAGNOSIS — E7800 Pure hypercholesterolemia, unspecified: Secondary | ICD-10-CM

## 2023-10-09 LAB — QUANTIFERON-TB GOLD PLUS
QuantiFERON Mitogen Value: 6.17 [IU]/mL
QuantiFERON Nil Value: 0.05 [IU]/mL
QuantiFERON TB1 Ag Value: 0.06 [IU]/mL
QuantiFERON TB2 Ag Value: 0.07 [IU]/mL
QuantiFERON-TB Gold Plus: NEGATIVE

## 2023-10-09 NOTE — Telephone Encounter (Signed)
 FYI Only or Action Required?: FYI only for provider.  Patient was last seen in primary care on 10/05/2023 by Alvan Dorothyann BIRCH, MD.  Called Nurse Triage reporting URI.  Symptoms began yesterday.  Interventions attempted: Nothing.  Symptoms are: gradually improving.  Triage Disposition: Home Care  Patient/caregiver understands and will follow disposition?: Yes Reason for Disposition  Common cold with no complications  Answer Assessment - Initial Assessment Questions 1. ONSET: When did the nasal discharge start?      12 hours  5. FEVER: Do you have a fever? If Yes, ask: What is your temperature, how was it measured, and when did it start?     Unknown, ear and throat congestion  7. OTHER SYMPTOMS: Do you have any other symptoms? (e.g., earache, mouth sores, sore throat, wheezing)     Headache, resolved with aspirin  Protocols used: Common Cold-A-AH Copied from CRM #[id number]. Topic: Clinical - Red Word Triage >> Oct 09, 2023 10:46 AM Merlynn LABOR wrote: Red Word that prompted transfer to Nurse Triage: Sore throat, mild fever, chills, headache, ear pain +1 day

## 2023-10-09 NOTE — Progress Notes (Signed)
 Negative for  TB, please update form and let her know when ready to pick up

## 2023-10-10 ENCOUNTER — Telehealth: Payer: Self-pay

## 2023-10-10 NOTE — Telephone Encounter (Signed)
 FYI Only or Action Required?: FYI only for provider.  Patient was last seen in primary care on 10/05/2023 by Alexis Dorothyann BIRCH, MD.  Called Nurse Triage reporting URI.  Symptoms began several days ago.  Interventions attempted: OTC medications: Mucines and Rest, hydration, or home remedies.  Symptoms are: gradually improving.  Triage Disposition: Information or Advice Only Call, Home Care  Patient/caregiver understands and will follow disposition?: Yes  FYI: Pt asking about isolation period now that she tested positive for COVID yesterday.  This RN advised that patient to try to stay home and avoid contact with others for 5 days after a positive COVID test and to avoid people with a weakened immune for 10 days after a positive covid test. Pt verbalized understanding and expresses no other concerns at this time.   Reason for Disposition  General information question, no triage required and triager able to answer question  Answer Assessment - Initial Assessment Questions 1. REASON FOR CALL: What is the main reason for your call? or How can I best help you?     Patient called to follow-up on her NT call from 8/11 to advise that her home covid test was positive and she wanted to know how long does she have to isolate  2. SYMPTOMS : Do you have any symptoms?      Patient still endorsing symptoms but says that are mild and improving compared to when they started on Sunday  3. OTHER QUESTIONS: Do you have any other questions?     Patient asking who would be someone with weakned immune systerm  Protocols used: Information Only Call - No Triage-A-AH

## 2023-10-10 NOTE — Telephone Encounter (Signed)
 Copied from CRM [id number]. Topic: General - Other >> Oct 02, 2023  1:39 PM Shamecia H wrote: Reason for CRM: Patient was disconnected from speaking to someone in clinic about her physical form. 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 [phone]

## 2023-10-11 NOTE — Telephone Encounter (Signed)
 Pt was seen. Form completed. Pt called.

## 2023-10-31 ENCOUNTER — Encounter: Payer: Self-pay | Admitting: Sports Medicine

## 2023-11-09 ENCOUNTER — Ambulatory Visit

## 2023-11-09 VITALS — Ht 67.0 in | Wt 185.0 lb

## 2023-11-09 DIAGNOSIS — Z0000 Encounter for general adult medical examination without abnormal findings: Secondary | ICD-10-CM | POA: Diagnosis not present

## 2023-11-09 NOTE — Patient Instructions (Signed)
  Ms. Engel , Thank you for taking time to come for your Medicare Wellness Visit. I appreciate your ongoing commitment to your health goals. Please review the following plan we discussed and let me know if I can assist you in the future.   These are the goals we discussed:  Goals       Patient Stated (pt-stated)      Would like to work on loosing 10 lbs.      Patient Stated (pt-stated)      Would like to loose 20 lbs.      Patient Stated (pt-stated)      Patient stated that he would like to loose 10 lbs.      Patient Stated      Patient states she would like to lose a few pounds.        This is a list of the screening recommended for you and due dates:  Health Maintenance  Topic Date Due   Zoster (Shingles) Vaccine (1 of 2) 12/21/1999   Flu Shot  09/29/2023   COVID-19 Vaccine (5 - 2025-26 season) 10/30/2023   DEXA scan (bone density measurement)  08/04/2024   DTaP/Tdap/Td vaccine (3 - Td or Tdap) 11/05/2024   Breast Cancer Screening  11/06/2024   Medicare Annual Wellness Visit  11/08/2024   Colon Cancer Screening  05/08/2033   Pneumococcal Vaccine for age over 68  Completed   Hepatitis C Screening  Completed   HPV Vaccine  Aged Out   Meningitis B Vaccine  Aged Out

## 2023-11-09 NOTE — Progress Notes (Signed)
 Subjective:   Alexis Fisher is a 73 y.o. female who presents for Medicare Annual (Subsequent) preventive examination.  Visit Complete: Virtual I connected with  Alexis Fisher on 11/09/23 by a audio enabled telemedicine application and verified that I am speaking with the correct person using two identifiers.  Patient Location: Home  Provider Location: Office/Clinic  I discussed the limitations of evaluation and management by telemedicine. The patient expressed understanding and agreed to proceed.  Vital Signs: Because this visit was a virtual/telehealth visit, some criteria may be missing or patient reported. Any vitals not documented were not able to be obtained and vitals that have been documented are patient reported.  Patient Medicare AWV questionnaire was completed by the patient on n/a; I have confirmed that all information answered by patient is correct and no changes since this date.  Cardiac Risk Factors include: advanced age (>48men, >46 women);smoking/ tobacco exposure;dyslipidemia     Objective:    Today's Vitals   11/09/23 1531  Weight: 185 lb (83.9 kg)  Height: 5' 7 (1.702 m)   Body mass index is 28.98 kg/m.     11/09/2023    3:41 PM 11/01/2022    8:45 AM 10/25/2021    3:11 PM 08/18/2021   10:12 AM 10/23/2020    1:11 PM 11/14/2018    9:18 AM 08/24/2017    9:42 AM  Advanced Directives  Does Patient Have a Medical Advance Directive? Yes Yes Yes Yes Yes No Yes   Type of Advance Directive Healthcare Power of Attorney Living will Living will Healthcare Power of [place];Living will Living will;Healthcare Power of [employer] Power of [place];Living will  Does patient want to make changes to medical advance directive?  No - Patient declined No - Patient declined  No - Patient declined    Copy of Healthcare Power of Attorney in Chart?    No - copy requested No - copy requested  No - copy requested   Would patient like information on creating a medical advance  directive?      No - Patient declined      Data saved with a previous flowsheet row definition    Current Medications (verified) Outpatient Encounter Medications as of 11/09/2023  Medication Sig   atorvastatin  (LIPITOR) 40 MG tablet TAKE 1 TABLET BY MOUTH DAILY AT 6 IN THE EVENING   cholecalciferol (VITAMIN D ) 1000 units tablet Take 1,000 Units by mouth daily.   No facility-administered encounter medications on file as of 11/09/2023.    Allergies (verified) Patient has no known allergies.   History: History reviewed. No pertinent past medical history. Past Surgical History:  Procedure Laterality Date   wrsit surgery, plate Left 07/7981   Dr. Hendrick   Family History  Problem Relation Age of Onset   Kidney failure Father    Social History   Socioeconomic History   Marital status: Married    Spouse name: Alexis Fisher   Number of children: 2   Years of education: 16   Highest education level: Bachelor's degree (e.g., BA, AB, BS)  Occupational History   Occupation: retired    Comment: Works part-time as a Lawyer.  Tobacco Use   Smoking status: Former    Current packs/day: 0.00    Types: Cigarettes    Quit date: 02/28/1998    Years since quitting: 25.7   Smokeless tobacco: Never  Vaping Use   Vaping status: Never Used  Substance and Sexual Activity   Alcohol use: Yes    Alcohol/week:  3.0 - 4.0 standard drinks of alcohol    Types: 3 - 4 Cans of beer per week    Comment: wine and beer   Drug use: No   Sexual activity: Not Currently    Partners: Male  Other Topics Concern   Not on file  Social History Narrative   Lives with her husband. She enjoys playing golf, pickle ball, walking and reading.   Social Drivers of Corporate investment banker Strain: Low Risk  (11/09/2023)   Overall Financial Resource Strain (CARDIA)    Difficulty of Paying Living Expenses: Not hard at all  Food Insecurity: No Food Insecurity (11/09/2023)   Hunger Vital Sign    Worried  About Running Out of Food in the Last Year: Never true    Ran Out of Food in the Last Year: Never true  Transportation Needs: No Transportation Needs (11/09/2023)   PRAPARE - Administrator, Civil Service (Medical): No    Lack of Transportation (Non-Medical): No  Physical Activity: Insufficiently Active (11/09/2023)   Exercise Vital Sign    Days of Exercise per Week: 1 day    Minutes of Exercise per Session: 90 min  Stress: No Stress Concern Present (11/09/2023)   [employer] of Occupational Health - Occupational Stress Questionnaire    Feeling of Stress: Not at all  Social Connections: Moderately Integrated (11/09/2023)   Social Connection and Isolation Panel    Frequency of Communication with Friends and Family: Once a week    Frequency of Social Gatherings with Friends and Family: More than three times a week    Attends Religious Services: Never    Database administrator or Organizations: Yes    Attends Engineer, structural: 1 to 4 times per year    Marital Status: Married    Tobacco Counseling Counseling given: Not Answered   Clinical Intake:  Pre-visit preparation completed: Yes  Pain : No/denies pain     BMI - recorded: 28.98 Nutritional Status: BMI 25 -29 Overweight Nutritional Risks: None Diabetes: No  How often do you need to have someone help you when you read instructions, pamphlets, or other written materials from your doctor or pharmacy?: 1 - Never What is the last grade level you completed in school?: 16  Interpreter Needed?: No      Activities of Daily Living    11/09/2023    3:34 PM 11/05/2023   10:13 AM  In your present state of health, do you have any difficulty performing the following activities:  Hearing? 1 1  Vision? 0 0  Difficulty concentrating or making decisions? 0 0  Walking or climbing stairs? 0 0  Dressing or bathing? 0 0  Doing errands, shopping? 0 0  Preparing Food and eating ? N N  Using the Toilet? N N   In the past six months, have you accidently leaked urine? N N  Do you have problems with loss of bowel control? N N  Managing your Medications? N N  Managing your Finances? N N  Housekeeping or managing your Housekeeping? N N    Patient Care Team: Alexis Dorothyann BIRCH, MD as PCP - General (Family Medicine) Alexis Ditch, MD as Referring Physician (General Practice)  Indicate any recent Medical Services you may have received from other than [facility] providers in the past year (date may be approximate).     Assessment:   This is a routine wellness examination for Alexis Fisher.  Hearing/Vision screen No results found.  Goals Addressed             This Visit's Progress    Patient Stated       Patient states she would like to lose a few pounds.       Depression Screen    11/09/2023    3:39 PM 11/01/2022    8:46 AM 06/02/2022   10:12 AM 10/25/2021    3:11 PM 11/16/2020    2:31 PM 10/23/2020    1:11 PM 11/14/2018    9:19 AM  PHQ 2/9 Scores  PHQ - 2 Score 0 0 0 0 0 0 0    Fall Risk    11/09/2023    3:41 PM 11/05/2023   10:13 AM 11/01/2022    8:46 AM 06/02/2022   10:12 AM 10/25/2021    3:11 PM  Fall Risk   Falls in the past year? 0 0 0 0 0  Number falls in past yr: 0 0 0 0 0  Injury with Fall? 0 0 0 0 0  Risk for fall due to : No Fall Risks  No Fall Risks No Fall Risks No Fall Risks  Follow up Falls evaluation completed  Falls evaluation completed Falls evaluation completed Falls evaluation completed      Data saved with a previous flowsheet row definition    MEDICARE RISK AT HOME: Medicare Risk at Home Any stairs in or around the home?: Yes If so, are there any without handrails?: No Home free of loose throw rugs in walkways, pet beds, electrical cords, etc?: No Adequate lighting in your home to reduce risk of falls?: Yes Life alert?: No Use of a cane, walker or w/c?: No Grab bars in the bathroom?: No Shower chair or bench in shower?: Yes Elevated toilet seat or a  handicapped toilet?: No  TIMED UP AND GO:  Was the test performed?  No    Cognitive Function:        11/09/2023    3:42 PM 11/01/2022    8:54 AM 10/25/2021    3:18 PM 10/23/2020    1:23 PM 08/24/2017    9:41 AM  6CIT Screen  What Year? 0 points 0 points 0 points 0 points 0 points  What month? 0 points 0 points 0 points 0 points 0 points  What time? 0 points 0 points 0 points 0 points 0 points  Count back from 20 0 points 0 points 0 points 0 points 0 points  Months in reverse 0 points 0 points 0 points 0 points 0 points  Repeat phrase 0 points 0 points 0 points 0 points 2 points  Total Score 0 points 0 points 0 points 0 points 2 points    Immunizations Immunization History  Administered Date(s) Administered   Fluad Quad(high Dose 65+) 11/14/2018, 11/15/2019, 11/16/2020   INFLUENZA, HIGH DOSE SEASONAL PF 12/25/2013, 11/30/2017   Influenza,inj,Quad PF,6+ Mos 11/06/2014   Influenza,inj,quad, With Preservative 11/29/2016   PFIZER(Purple Top)SARS-COV-2 Vaccination 03/20/2019, 04/16/2019, 12/01/2019   PNEUMOCOCCAL CONJUGATE-20 08/03/2023   Pfizer Covid-19 Vaccine Bivalent Booster 82yrs & up 08/03/2023   Pneumococcal Conjugate-13 08/06/2015   Pneumococcal Polysaccharide-23 08/23/2016   Tdap 04/03/2007, 11/06/2014   Zoster, Live 10/11/2010    TDAP status: Up to date  Flu Vaccine status: Due, Education has been provided regarding the importance of this vaccine. Advised may receive this vaccine at local pharmacy or Health Dept. Aware to provide a copy of the vaccination record if obtained from local pharmacy or Health Dept. Verbalized acceptance and  understanding.  Pneumococcal vaccine status: Up to date  Covid-19 vaccine status: Information provided on how to obtain vaccines.   Qualifies for Shingles Vaccine? Yes   Zostavax completed Yes   Shingrix Completed?: No.    Education has been provided regarding the importance of this vaccine. Patient has been advised to call insurance  company to determine out of pocket expense if they have not yet received this vaccine. Advised may also receive vaccine at local pharmacy or Health Dept. Verbalized acceptance and understanding.  Screening Tests Health Maintenance  Topic Date Due   Zoster Vaccines- Shingrix (1 of 2) 12/21/1999   Influenza Vaccine  09/29/2023   COVID-19 Vaccine (5 - 2025-26 season) 10/30/2023   DEXA SCAN  08/04/2024   DTaP/Tdap/Td (3 - Td or Tdap) 11/05/2024   Mammogram  11/06/2024   Medicare Annual Wellness (AWV)  11/08/2024   Colonoscopy  05/08/2033   Pneumococcal Vaccine: 50+ Years  Completed   Hepatitis C Screening  Completed   HPV VACCINES  Aged Out   Meningococcal B Vaccine  Aged Out    Health Maintenance  Health Maintenance Due  Topic Date Due   Zoster Vaccines- Shingrix (1 of 2) 12/21/1999   Influenza Vaccine  09/29/2023   COVID-19 Vaccine (5 - 2025-26 season) 10/30/2023    Colorectal cancer screening: Type of screening: Colonoscopy. Completed 05/09/2023. Repeat every 10 years  Mammogram status: Ordered 11/09/2023. Pt provided with contact info and advised to call to schedule appt.   Bone Density status: Completed 08/05/2019. Results reflect: Bone density results: NORMAL. Repeat every 5 years.  Lung Cancer Screening: (Low Dose CT Chest recommended if Age 43-80 years, 20 pack-year currently smoking OR have quit w/in 15years.) does not qualify.   Lung Cancer Screening Referral: n/a  Additional Screening:  Hepatitis C Screening: does qualify; Completed 05/20/2015  Vision Screening: Recommended annual ophthalmology exams for early detection of glaucoma and other disorders of the eye. Is the patient up to date with their annual eye exam?  Yes  Who is the provider or what is the name of the office in which the patient attends annual eye exams? [facility] If pt is not established with a provider, would they like to be referred to a provider to establish care? N/a.   Dental Screening:  Recommended annual dental exams for proper oral hygiene   Community Resource Referral / Chronic Care Management: CRR required this visit?  No   CCM required this visit?  No     Plan:     I have personally reviewed and noted the following in the patient's chart:   Medical and social history Use of alcohol, tobacco or illicit drugs  Current medications and supplements including opioid prescriptions. Patient is not currently taking opioid prescriptions. Functional ability and status Nutritional status Physical activity Advanced directives List of other physicians Hospitalizations, surgeries, and ER visits in previous 12 months. None Vitals Screenings to include cognitive, depression, and falls Referrals and appointments  In addition, I have reviewed and discussed with patient certain preventive protocols, quality metrics, and best practice recommendations. A written personalized care plan for preventive services as well as general preventive health recommendations were provided to patient.     Alexis Fisher, CMA   11/09/2023   After Visit Summary: (MyChart) Due to this being a telephonic visit, the after visit summary with patients personalized plan was offered to patient via MyChart   Nurse Notes:    Alexis Fisher is a 73 y.o. female patient of  Alexis Dorothyann BIRCH, MD who had a Medicare Annual Wellness Visit today. Mikiya is Retired and lives with their spouse. She has 2 children. She reports that she is socially active and does interact with friends/family regularly. She is moderately physically active and enjoys  playing golf, pickle ball, walking and reading.

## 2023-11-13 ENCOUNTER — Ambulatory Visit: Admitting: Sports Medicine

## 2023-12-07 ENCOUNTER — Encounter: Payer: Self-pay | Admitting: Family Medicine

## 2023-12-07 ENCOUNTER — Ambulatory Visit (INDEPENDENT_AMBULATORY_CARE_PROVIDER_SITE_OTHER): Admitting: Family Medicine

## 2023-12-07 VITALS — BP 124/72 | HR 84 | Ht 67.5 in | Wt 189.7 lb

## 2023-12-07 DIAGNOSIS — R21 Rash and other nonspecific skin eruption: Secondary | ICD-10-CM

## 2023-12-07 DIAGNOSIS — Z0000 Encounter for general adult medical examination without abnormal findings: Secondary | ICD-10-CM

## 2023-12-07 DIAGNOSIS — E7800 Pure hypercholesterolemia, unspecified: Secondary | ICD-10-CM | POA: Diagnosis not present

## 2023-12-07 DIAGNOSIS — N1831 Chronic kidney disease, stage 3a: Secondary | ICD-10-CM | POA: Diagnosis not present

## 2023-12-07 DIAGNOSIS — Z23 Encounter for immunization: Secondary | ICD-10-CM

## 2023-12-07 MED ORDER — VALACYCLOVIR HCL 1 G PO TABS: 1000.0000 mg | ORAL_TABLET | Freq: Two times a day (BID) | ORAL | 0 refills | Status: AC

## 2023-12-07 MED ORDER — TRIAMCINOLONE ACETONIDE 0.1 % EX CREA: 1.0000 | TOPICAL_CREAM | Freq: Two times a day (BID) | CUTANEOUS | 0 refills | Status: AC

## 2023-12-07 NOTE — Progress Notes (Signed)
 Complete physical exam  Patient: Alexis Fisher   DOB: January 07, 1951   73 y.o. Female  MRN: [id number]  Subjective:    Chief Complaint  Patient presents with   Annual Exam    Alexis Fisher is a 73 y.o. female who presents today for a complete physical exam. She reports consuming a general diet. Plays golf for exercise  She generally feels well. She reports sleeping well. She does not have additional problems to discuss today.   She has some dystrophic nails and has an upcoming appt with podiatry  Had MOH's surgery on a lesion on her nose last fall.   She has a new rash that popped up with a single lesion on right upper arm lat night, itcy. Now has a rash on her abdomen, aslo very itchy. No new soaps, detergents, ec No new foods.     Most recent fall risk assessment:    11/09/2023    3:41 PM  Fall Risk   Falls in the past year? 0  Number falls in past yr: 0  Injury with Fall? 0  Risk for fall due to : No Fall Risks  Follow up Falls evaluation completed     Most recent depression screenings:    11/09/2023    3:39 PM 11/01/2022    8:46 AM  PHQ 2/9 Scores  PHQ - 2 Score 0 0        Patient Care Team: Alvan Dorothyann BIRCH, MD as PCP - General (Family Medicine) Melonie Ditch, MD as Referring Physician (General Practice)   Outpatient Medications Prior to Visit  Medication Sig   atorvastatin  (LIPITOR) 40 MG tablet TAKE 1 TABLET BY MOUTH DAILY AT 6 IN THE EVENING   cholecalciferol (VITAMIN D ) 1000 units tablet Take 1,000 Units by mouth daily.   No facility-administered medications prior to visit.    ROS        Objective:     BP 124/72   Pulse 84   Ht 5' 7.5 (1.715 m)   Wt 189 lb 11.2 oz (86 kg)   SpO2 96%   BMI 29.27 kg/m     Physical Exam Constitutional:      Appearance: Normal appearance.  HENT:     Head: Normocephalic and atraumatic.     Right Ear: Tympanic membrane, ear canal and external ear normal.     Left Ear: Tympanic membrane, ear canal  and external ear normal.     Nose: Nose normal.     Mouth/Throat:     Pharynx: Oropharynx is clear.  Eyes:     Extraocular Movements: Extraocular movements intact.     Conjunctiva/sclera: Conjunctivae normal.     Pupils: Pupils are equal, round, and reactive to light.  Neck:     Thyroid: No thyromegaly.  Cardiovascular:     Rate and Rhythm: Normal rate and regular rhythm.  Pulmonary:     Effort: Pulmonary effort is normal.     Breath sounds: Normal breath sounds.  Abdominal:     General: Bowel sounds are normal.     Palpations: Abdomen is soft.     Tenderness: There is no abdominal tenderness.  Musculoskeletal:        General: No swelling.     Cervical back: Neck supple.  Skin:    General: Skin is warm and dry.  Neurological:     Mental Status: She is oriented to person, place, and time.  Psychiatric:        Mood and Affect: Mood normal.  Behavior: Behavior normal.      No results found for any visits on 12/07/23.      Assessment & Plan:    Routine Health Maintenance and Physical Exam  Immunization History  Administered Date(s) Administered   Fluad Quad(high Dose 65+) 11/14/2018, 11/15/2019, 11/16/2020   INFLUENZA, HIGH DOSE SEASONAL PF 12/25/2013, 11/30/2017, 12/07/2023   Influenza,inj,Quad PF,6+ Mos 11/06/2014   Influenza,inj,quad, With Preservative 11/29/2016   PFIZER(Purple Top)SARS-COV-2 Vaccination 03/20/2019, 04/16/2019, 12/01/2019   PNEUMOCOCCAL CONJUGATE-20 08/03/2023   Pfizer Covid-19 Vaccine Bivalent Booster 61yrs & up 08/03/2023   Pneumococcal Conjugate-13 08/06/2015   Pneumococcal Polysaccharide-23 08/23/2016   Tdap 04/03/2007, 11/06/2014   Zoster, Live 10/11/2010    Health Maintenance  Topic Date Due   Zoster Vaccines- Shingrix (1 of 2) 12/21/1999   COVID-19 Vaccine (5 - 2025-26 season) 10/30/2023   DEXA SCAN  08/04/2024   DTaP/Tdap/Td (3 - Td or Tdap) 11/05/2024   Mammogram  11/06/2024   Medicare Annual Wellness (AWV)  11/08/2024    Colonoscopy  05/08/2033   Pneumococcal Vaccine: 50+ Years  Completed   Influenza Vaccine  Completed   Hepatitis C Screening  Completed   Meningococcal B Vaccine  Aged Out    Discussed health benefits of physical activity, and encouraged her to engage in regular exercise appropriate for her age and condition.  Problem List Items Addressed This Visit       Genitourinary   CKD stage G3a/A1, GFR 45-59 and albumin creatinine ratio <30 mg/g (HCC)   Relevant Orders   CMP14+EGFR   Lipid panel   CBC   Urine Microalbumin w/creat. ratio     Other   Hyperlipidemia   Relevant Orders   CMP14+EGFR   Lipid panel   CBC   Urine Microalbumin w/creat. ratio   Other Visit Diagnoses       Wellness examination    -  Primary     Encounter for immunization       Relevant Orders   Flu vaccine HIGH DOSE PF(Fluzone Trivalent) (Completed)     Rash          Keep up a regular exercise program and make sure you are eating a healthy diet Try to eat 4 servings of dairy a day, or if you are lactose intolerant take a calcium  with vitamin D  daily.  Encouraged her to get her shingles vaccine.   Will get up to date labs.     Return in about 6 months (around 06/06/2024) for recheck kidneys.     Dorothyann Byars, MD

## 2023-12-08 ENCOUNTER — Ambulatory Visit: Payer: Self-pay | Admitting: Family Medicine

## 2023-12-08 LAB — CMP14+EGFR
ALT: 13 IU/L (ref 0–32)
AST: 20 IU/L (ref 0–40)
Albumin: 4.6 g/dL (ref 3.8–4.8)
Alkaline Phosphatase: 67 IU/L (ref 49–135)
BUN/Creatinine Ratio: 18 (ref 12–28)
BUN: 18 mg/dL (ref 8–27)
Bilirubin Total: 0.5 mg/dL (ref 0.0–1.2)
CO2: 22 mmol/L (ref 20–29)
Calcium: 9.3 mg/dL (ref 8.7–10.3)
Chloride: 105 mmol/L (ref 96–106)
Creatinine, Ser: 0.99 mg/dL (ref 0.57–1.00)
Globulin, Total: 2.3 g/dL (ref 1.5–4.5)
Glucose: 69 mg/dL — ABNORMAL LOW (ref 70–99)
Potassium: 4.1 mmol/L (ref 3.5–5.2)
Sodium: 143 mmol/L (ref 134–144)
Total Protein: 6.9 g/dL (ref 6.0–8.5)
eGFR: 60 mL/min/1.73 (ref >59)

## 2023-12-08 LAB — CBC
Hematocrit: 41.3 % (ref 34.0–46.6)
Hemoglobin: 12.9 g/dL (ref 11.1–15.9)
MCH: 28.2 pg (ref 26.6–33.0)
MCHC: 31.2 g/dL — ABNORMAL LOW (ref 31.5–35.7)
MCV: 90 fL (ref 79–97)
Platelets: 204 x10E3/uL (ref 150–450)
RBC: 4.57 x10E6/uL (ref 3.77–5.28)
RDW: 12.5 % (ref 11.7–15.4)
WBC: 4.7 x10E3/uL (ref 3.4–10.8)

## 2023-12-08 LAB — MICROALBUMIN / CREATININE URINE RATIO
Creatinine, Urine: 139.2 mg/dL
Microalb/Creat Ratio: 5 mg/g{creat} (ref 0–29)
Microalbumin, Urine: 6.8 ug/mL

## 2023-12-08 LAB — LIPID PANEL
Chol/HDL Ratio: 3 ratio (ref 0.0–4.4)
Cholesterol, Total: 186 mg/dL (ref 100–199)
HDL: 62 mg/dL (ref >39)
LDL Chol Calc (NIH): 109 mg/dL — ABNORMAL HIGH (ref 0–99)
Triglycerides: 81 mg/dL (ref 0–149)
VLDL Cholesterol Cal: 15 mg/dL (ref 5–40)

## 2023-12-08 NOTE — Progress Notes (Signed)
 Hi Tanya, kidney function is stable liver enzymes are normal.  LDL cholesterol is up a little bit compared to last year such as continue to work on healthy Mediterranean diet and regular exercise.  Blood count is normal no sign of anemia.  No excess protein in the urine.

## 2023-12-12 DIAGNOSIS — I999 Unspecified disorder of circulatory system: Secondary | ICD-10-CM | POA: Diagnosis not present

## 2023-12-12 DIAGNOSIS — M2042 Other hammer toe(s) (acquired), left foot: Secondary | ICD-10-CM | POA: Diagnosis not present

## 2023-12-12 DIAGNOSIS — M205X2 Other deformities of toe(s) (acquired), left foot: Secondary | ICD-10-CM | POA: Diagnosis not present

## 2023-12-12 DIAGNOSIS — M2041 Other hammer toe(s) (acquired), right foot: Secondary | ICD-10-CM | POA: Diagnosis not present

## 2023-12-12 DIAGNOSIS — B351 Tinea unguium: Secondary | ICD-10-CM | POA: Diagnosis not present

## 2024-01-06 ENCOUNTER — Other Ambulatory Visit: Payer: Self-pay | Admitting: Family Medicine

## 2024-01-06 DIAGNOSIS — E7800 Pure hypercholesterolemia, unspecified: Secondary | ICD-10-CM

## 2024-01-30 DIAGNOSIS — Z1231 Encounter for screening mammogram for malignant neoplasm of breast: Secondary | ICD-10-CM | POA: Diagnosis not present

## 2024-01-30 LAB — HM MAMMOGRAPHY

## 2024-01-31 ENCOUNTER — Encounter: Payer: Self-pay | Admitting: Family Medicine

## 2024-06-06 ENCOUNTER — Ambulatory Visit: Admitting: Family Medicine

## 2024-11-12 ENCOUNTER — Ambulatory Visit
